# Patient Record
Sex: Male | Born: 1965 | Race: Black or African American | Hispanic: No | Marital: Married | Smoking: Never smoker
Health system: Southern US, Community
[De-identification: ages and names within clinical notes are randomized; demographics above are authoritative.]

## PROBLEM LIST (undated history)

## (undated) HISTORY — PX: KNEE SURGERY: SHX244

---

## 2000-10-03 ENCOUNTER — Encounter: Payer: Self-pay | Admitting: Orthopedic Surgery

## 2000-10-03 ENCOUNTER — Encounter: Admission: RE | Admit: 2000-10-03 | Discharge: 2000-10-03 | Payer: Self-pay | Admitting: Orthopedic Surgery

## 2006-08-12 ENCOUNTER — Ambulatory Visit: Payer: Self-pay | Admitting: Sports Medicine

## 2006-10-08 ENCOUNTER — Emergency Department (HOSPITAL_COMMUNITY): Admission: EM | Admit: 2006-10-08 | Discharge: 2006-10-08 | Payer: Self-pay | Admitting: Emergency Medicine

## 2007-01-16 ENCOUNTER — Encounter: Admission: RE | Admit: 2007-01-16 | Discharge: 2007-02-28 | Payer: Self-pay | Admitting: Occupational Medicine

## 2008-06-17 ENCOUNTER — Ambulatory Visit: Payer: Self-pay | Admitting: Internal Medicine

## 2008-06-24 LAB — CBC WITH DIFFERENTIAL/PLATELET
Eosinophils Absolute: 0.1 10*3/uL (ref 0.0–0.5)
LYMPH%: 44.2 % (ref 14.0–48.0)
MCHC: 33.3 g/dL (ref 32.0–35.9)
MCV: 83.9 fL (ref 81.6–98.0)
MONO%: 12 % (ref 0.0–13.0)
NEUT#: 1.3 10*3/uL — ABNORMAL LOW (ref 1.5–6.5)
NEUT%: 39.8 % — ABNORMAL LOW (ref 40.0–75.0)
Platelets: 177 10*3/uL (ref 145–400)
RBC: 4.99 10*6/uL (ref 4.20–5.71)

## 2008-06-24 LAB — COMPREHENSIVE METABOLIC PANEL
Alkaline Phosphatase: 76 U/L (ref 39–117)
BUN: 20 mg/dL (ref 6–23)
Creatinine, Ser: 1.27 mg/dL (ref 0.40–1.50)
Glucose, Bld: 72 mg/dL (ref 70–99)
Sodium: 137 mEq/L (ref 135–145)
Total Bilirubin: 0.4 mg/dL (ref 0.3–1.2)
Total Protein: 7.5 g/dL (ref 6.0–8.3)

## 2008-11-19 ENCOUNTER — Ambulatory Visit: Payer: Self-pay | Admitting: Internal Medicine

## 2008-11-21 LAB — COMPREHENSIVE METABOLIC PANEL
AST: 20 U/L (ref 0–37)
Albumin: 4.5 g/dL (ref 3.5–5.2)
Alkaline Phosphatase: 72 U/L (ref 39–117)
Potassium: 4.3 mEq/L (ref 3.5–5.3)
Sodium: 142 mEq/L (ref 135–145)
Total Protein: 7.3 g/dL (ref 6.0–8.3)

## 2008-11-21 LAB — CBC WITH DIFFERENTIAL/PLATELET
EOS%: 4.6 % (ref 0.0–7.0)
MCH: 27.9 pg — ABNORMAL LOW (ref 28.0–33.4)
MCV: 84.5 fL (ref 81.6–98.0)
MONO%: 12.1 % (ref 0.0–13.0)
RBC: 5.16 10*6/uL (ref 4.20–5.71)
RDW: 12.9 % (ref 11.2–14.6)

## 2009-02-24 ENCOUNTER — Ambulatory Visit: Payer: Self-pay | Admitting: Internal Medicine

## 2009-02-26 LAB — CBC WITH DIFFERENTIAL/PLATELET
Basophils Absolute: 0 10*3/uL (ref 0.0–0.1)
Eosinophils Absolute: 0.1 10*3/uL (ref 0.0–0.5)
HCT: 40.3 % (ref 38.4–49.9)
HGB: 13.4 g/dL (ref 13.0–17.1)
MCV: 84.2 fL (ref 79.3–98.0)
MONO%: 12 % (ref 0.0–14.0)
NEUT#: 1.5 10*3/uL (ref 1.5–6.5)
NEUT%: 47.5 % (ref 39.0–75.0)
RDW: 13 % (ref 11.0–14.6)
lymph#: 1.1 10*3/uL (ref 0.9–3.3)

## 2009-02-26 LAB — COMPREHENSIVE METABOLIC PANEL
Albumin: 4.3 g/dL (ref 3.5–5.2)
BUN: 21 mg/dL (ref 6–23)
Calcium: 9.8 mg/dL (ref 8.4–10.5)
Chloride: 104 mEq/L (ref 96–112)
Glucose, Bld: 61 mg/dL — ABNORMAL LOW (ref 70–99)
Potassium: 4 mEq/L (ref 3.5–5.3)

## 2009-02-28 ENCOUNTER — Encounter: Payer: Self-pay | Admitting: Internal Medicine

## 2009-02-28 ENCOUNTER — Other Ambulatory Visit: Admission: RE | Admit: 2009-02-28 | Discharge: 2009-02-28 | Payer: Self-pay | Admitting: Internal Medicine

## 2009-03-13 LAB — CBC WITH DIFFERENTIAL/PLATELET
Eosinophils Absolute: 0 10*3/uL (ref 0.0–0.5)
MONO#: 0.5 10*3/uL (ref 0.1–0.9)
MONO%: 8.2 % (ref 0.0–14.0)
NEUT#: 3.5 10*3/uL (ref 1.5–6.5)
RBC: 4.89 10*6/uL (ref 4.20–5.82)
RDW: 12.8 % (ref 11.0–14.6)
WBC: 5.7 10*3/uL (ref 4.0–10.3)
lymph#: 1.7 10*3/uL (ref 0.9–3.3)
nRBC: 0 % (ref 0–0)

## 2009-07-20 ENCOUNTER — Emergency Department (HOSPITAL_COMMUNITY): Admission: EM | Admit: 2009-07-20 | Discharge: 2009-07-20 | Payer: Self-pay | Admitting: Emergency Medicine

## 2011-03-03 LAB — BONE MARROW EXAM: Bone Marrow Exam: 123

## 2011-03-03 LAB — DIFFERENTIAL
Basophils Absolute: 0 10*3/uL (ref 0.0–0.1)
Basophils Relative: 2 % — ABNORMAL HIGH (ref 0–1)
Eosinophils Absolute: 0.1 10*3/uL (ref 0.0–0.7)
Eosinophils Relative: 3 % (ref 0–5)
Lymphocytes Relative: 44 % (ref 12–46)
Lymphs Abs: 1.3 10*3/uL (ref 0.7–4.0)
Monocytes Absolute: 0.3 10*3/uL (ref 0.1–1.0)
Monocytes Relative: 10 % (ref 3–12)
Neutro Abs: 1.2 10*3/uL — ABNORMAL LOW (ref 1.7–7.7)
Neutrophils Relative %: 42 % — ABNORMAL LOW (ref 43–77)

## 2011-03-03 LAB — CBC
HCT: 41.2 % (ref 39.0–52.0)
Hemoglobin: 13.9 g/dL (ref 13.0–17.0)
MCHC: 33.6 g/dL (ref 30.0–36.0)
MCV: 85.4 fL (ref 78.0–100.0)
Platelets: 159 10*3/uL (ref 150–400)
RBC: 4.82 MIL/uL (ref 4.22–5.81)
RDW: 12.1 % (ref 11.5–15.5)
WBC: 2.9 10*3/uL — ABNORMAL LOW (ref 4.0–10.5)

## 2011-03-03 LAB — CHROMOSOME ANALYSIS, BONE MARROW

## 2011-07-21 ENCOUNTER — Other Ambulatory Visit: Payer: Self-pay | Admitting: Orthopedic Surgery

## 2011-07-21 ENCOUNTER — Ambulatory Visit (HOSPITAL_BASED_OUTPATIENT_CLINIC_OR_DEPARTMENT_OTHER)
Admission: RE | Admit: 2011-07-21 | Discharge: 2011-07-21 | Disposition: A | Discharge status: Home / Self Care | Payer: BC Managed Care – PPO | Source: Ambulatory Visit | Attending: Orthopedic Surgery | Admitting: Orthopedic Surgery

## 2011-07-21 DIAGNOSIS — L851 Acquired keratosis [keratoderma] palmaris et plantaris: Secondary | ICD-10-CM | POA: Insufficient documentation

## 2011-07-21 DIAGNOSIS — Z01812 Encounter for preprocedural laboratory examination: Secondary | ICD-10-CM | POA: Insufficient documentation

## 2011-07-21 DIAGNOSIS — L608 Other nail disorders: Secondary | ICD-10-CM | POA: Insufficient documentation

## 2011-07-22 LAB — KOH PREP: KOH Prep: NONE SEEN

## 2011-08-17 LAB — CULTURE, FUNGUS WITHOUT SMEAR

## 2011-08-24 NOTE — Op Note (Signed)
  NAMEGIRARD, KOONTZ            ACCOUNT NO.:  [id number]  MEDICAL RECORD NO.:  [id number]  LOCATION:                                 FACILITY:  PHYSICIAN:  Cindee Salt, M.D.       DATE OF BIRTH:  28-Jul-1966  DATE OF PROCEDURE:  07/21/2011 DATE OF DISCHARGE:                              OPERATIVE REPORT   PREOPERATIVE DIAGNOSIS:  Pigmented lesion, right ring finger nail bed.  POSTOPERATIVE DIAGNOSIS:  Pigmented lesion, right ring finger nail bed.  OPERATION:  Biopsy of pigmented lesion, right ring finger.  SURGEON:  Cindee Salt, MD  ANESTHESIA:  General with local infiltration, metacarpal block.  ANESTHESIOLOGIST:  Bedelia Person, MD  HISTORY:  The patient is a 45 year old male with a history of a pigmented lesion in his nail which has not disappeared.  He is admitted for biopsy.  Pre, peri, and postoperative course have been discussed along with risks and complications.  He is aware there is no guarantee with surgery, possibility of infection, recurrence, injury to arteries, nerves, tendons, incomplete relief of symptoms, dystrophy, possibility of this being a lesion other than infection.  He has elected to proceed to have this biopsied.  He is well aware of the potential for deformity to the nail on regrowth.  In the preoperative area, the patient is seen. The extremity marked by both the patient and surgeon.  Antibiotic given.  PROCEDURE IN DETAIL:  The patient was brought to the operating room where a general anesthetic was carried out without difficulty.  He was prepped using ChloraPrep, supine position with the right arm free.  A 3- minute dry time was allowed.  Time-out taken confirming the patient and procedure.  The nail plate was removed.  This was sent to pathology. The margin was then biopsied longitudinally.  This was sent to pathology for microscopic inspection.  The nail plate was cut.  Portions were sent for both culture and examination for KOH stain.  The wound  was irrigated.  The nail matrix was then repaired with 6-0 chromic sutures in a horizontal mattress manner.  A nonadherent gauze was placed in the nail fold.  A sterile compressive dressing and splint applied.  A metacarpal block was given with 0.25% Marcaine without epinephrine, approximately 7 mL was used.  The patient tolerated the procedure well and was taken to the recovery room for observation in satisfactory condition.  A Penrose drain was used for tourniquet control at the base of the finger.  This was removed prior to discharge from the operating room.  DISCHARGE SUMMARY:  He will be discharged home to return to the [facility] in 1 week on [place] [facility].          ______________________________ Cindee Salt, M.D.     GK/MEDQ  D:  07/21/2011  T:  07/21/2011  Job:  [id number]  Electronically Signed by Cindee Salt M.D. on 08/24/2011 04:39:19 PM

## 2013-08-09 ENCOUNTER — Other Ambulatory Visit: Payer: Self-pay | Admitting: Family Medicine

## 2013-08-09 ENCOUNTER — Ambulatory Visit
Admission: RE | Admit: 2013-08-09 | Discharge: 2013-08-09 | Disposition: A | Discharge status: Home / Self Care | Payer: BC Managed Care – PPO | Source: Ambulatory Visit | Attending: Family Medicine | Admitting: Family Medicine

## 2013-08-09 DIAGNOSIS — M129 Arthropathy, unspecified: Secondary | ICD-10-CM

## 2013-08-09 IMAGING — CR DG KNEE 1-2V*R*
2 series · 2 of 2 positions shown · non-contrast
Comparison: None.

CLINICAL DATA: Two months of pain

EXAM:
RIGHT KNEE - 1-2 VIEW

[t knee ap right]
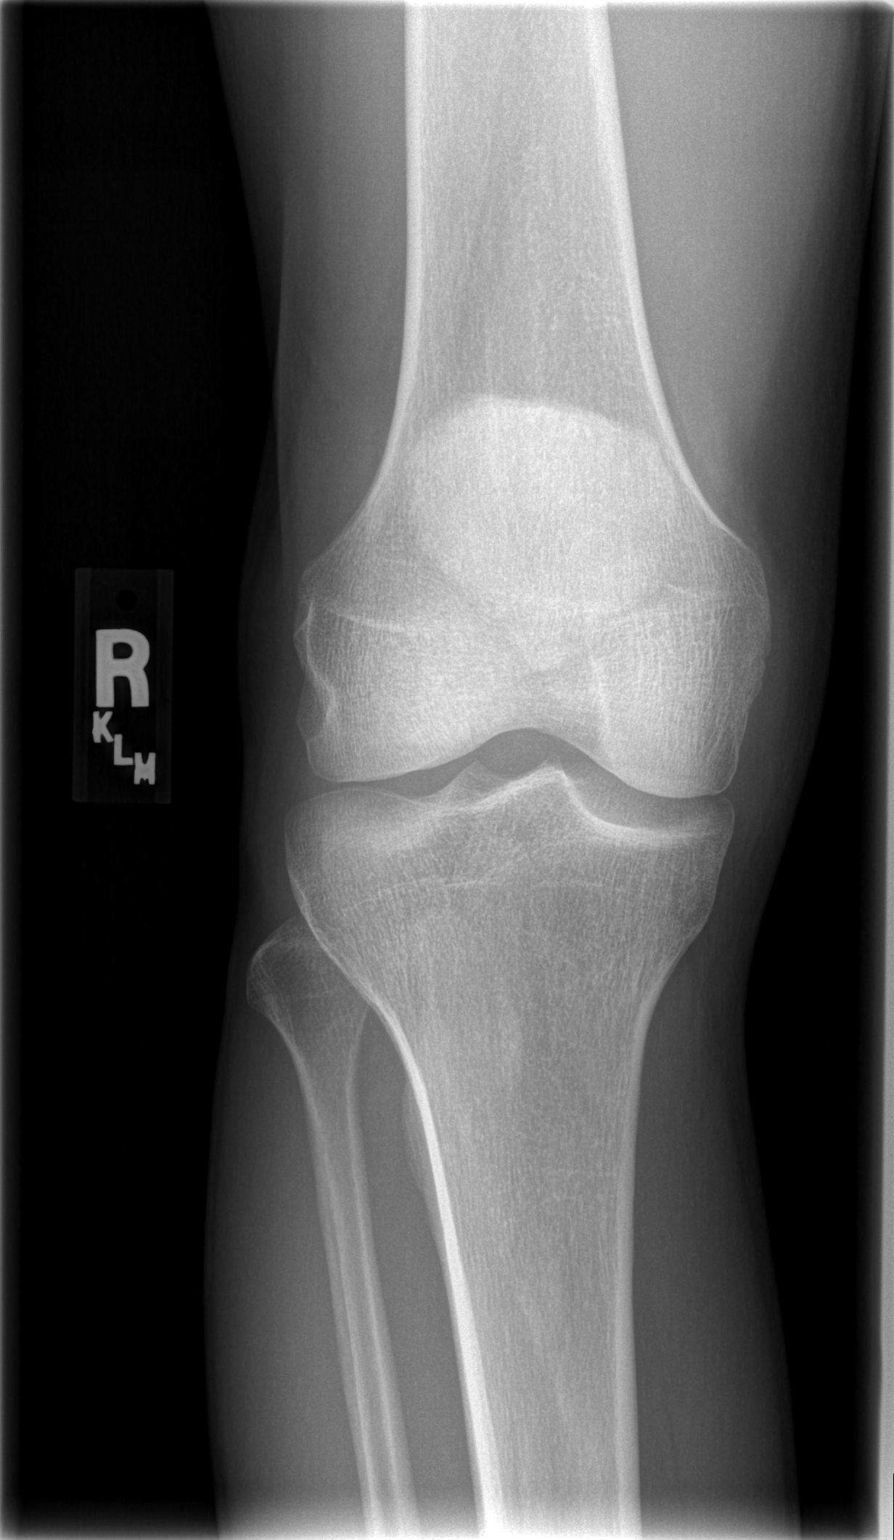

[t knee lat right]
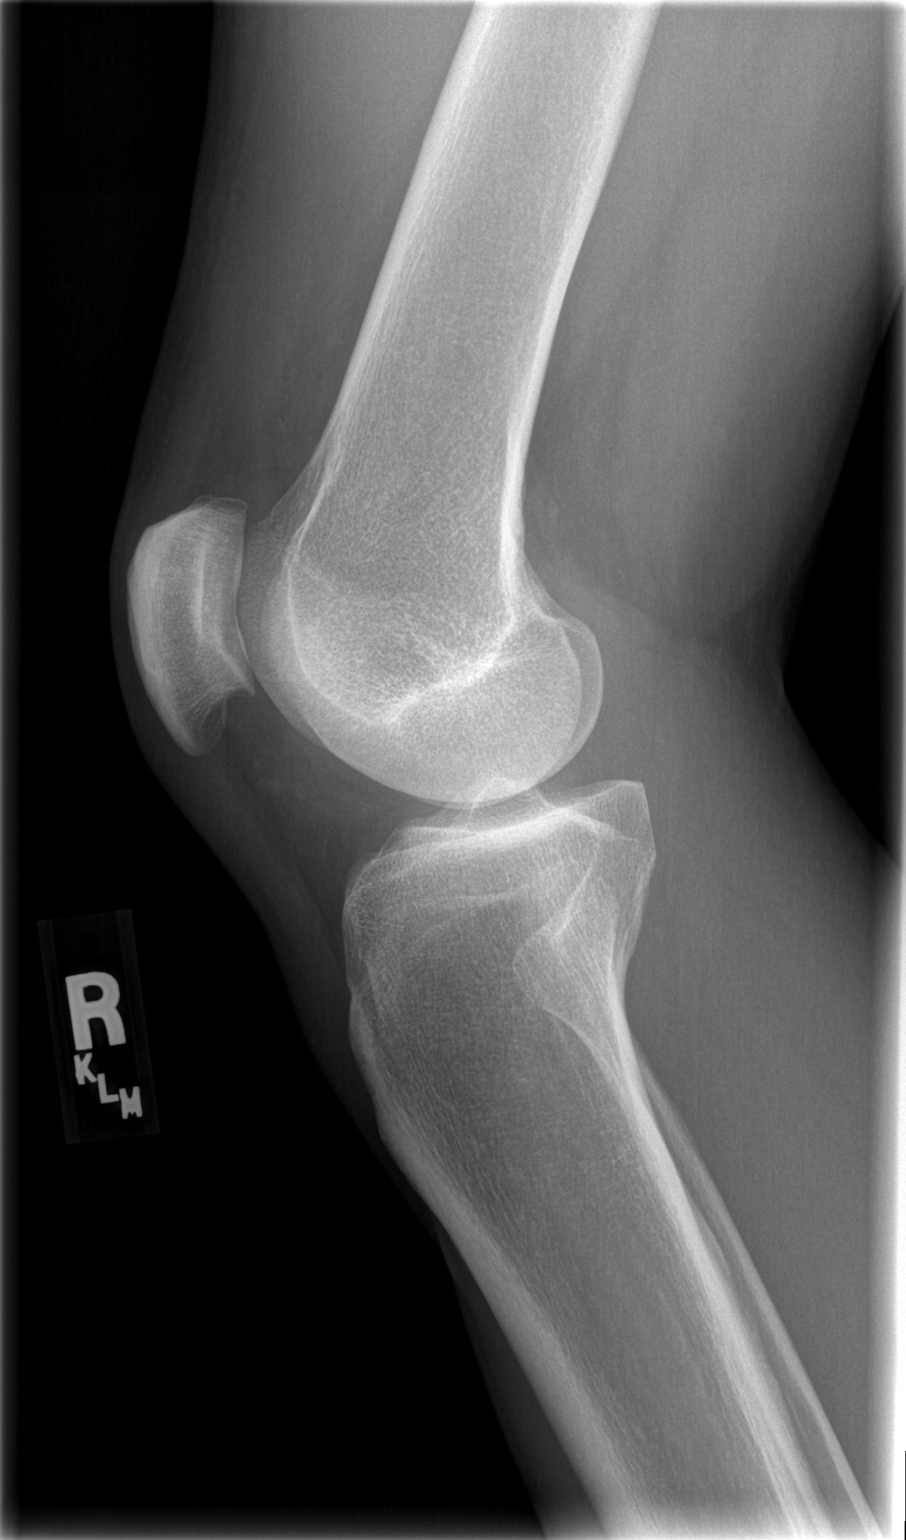

[2 of 2 positions shown; findings below may reference images not displayed]

FINDINGS: The right knee joint spaces appear normal. No fracture is seen. No
joint effusion is noted.
IMPRESSION: Negative.

## 2015-10-13 ENCOUNTER — Ambulatory Visit (INDEPENDENT_AMBULATORY_CARE_PROVIDER_SITE_OTHER): Payer: BLUE CROSS/BLUE SHIELD

## 2015-10-13 ENCOUNTER — Encounter: Payer: Self-pay | Admitting: Podiatry

## 2015-10-13 ENCOUNTER — Ambulatory Visit: Payer: BLUE CROSS/BLUE SHIELD

## 2015-10-13 ENCOUNTER — Ambulatory Visit (INDEPENDENT_AMBULATORY_CARE_PROVIDER_SITE_OTHER): Payer: BLUE CROSS/BLUE SHIELD | Admitting: Podiatry

## 2015-10-13 VITALS — BP 136/86 | HR 59 | Resp 16

## 2015-10-13 DIAGNOSIS — M779 Enthesopathy, unspecified: Secondary | ICD-10-CM

## 2015-10-13 DIAGNOSIS — M79672 Pain in left foot: Secondary | ICD-10-CM | POA: Diagnosis not present

## 2015-10-13 DIAGNOSIS — M2011 Hallux valgus (acquired), right foot: Secondary | ICD-10-CM | POA: Diagnosis not present

## 2015-10-13 NOTE — Patient Instructions (Signed)
Pre-Operative Instructions  Congratulations, you have decided to take an important step to improving your quality of life.  You can be assured that the doctors of [facility] will be with you every step of the way.  1. Plan to be at the surgery center/hospital at least 1 (one) hour prior to your scheduled time unless otherwise directed by the surgical center/hospital staff.  You must have a responsible adult accompany you, remain during the surgery and drive you home.  Make sure you have directions to the surgical center/hospital and know how to get there on time. 2. For hospital based surgery you will need to obtain a history and physical form from your family physician within 1 month prior to the date of surgery- we will give you a form for you primary physician.  3. We make every effort to accommodate the date you request for surgery.  There are however, times where surgery dates or times have to be moved.  We will contact you as soon as possible if a change in schedule is required.   4. No Aspirin/Ibuprofen for one week before surgery.  If you are on aspirin, any non-steroidal anti-inflammatory medications (Mobic, Aleve, Ibuprofen) you should stop taking it 7 days prior to your surgery.  You make take Tylenol  For pain prior to surgery.  5. Medications- If you are taking daily heart and blood pressure medications, seizure, reflux, allergy, asthma, anxiety, pain or diabetes medications, make sure the surgery center/hospital is aware before the day of surgery so they may notify you which medications to take or avoid the day of surgery. 6. No food or drink after midnight the night before surgery unless directed otherwise by surgical center/hospital staff. 7. No alcoholic beverages 24 hours prior to surgery.  No smoking 24 hours prior to or 24 hours after surgery. 8. Wear loose pants or shorts- loose enough to fit over bandages, boots, and casts. 9. No slip on shoes, sneakers are best. 10. Bring  your boot with you to the surgery center/hospital.  Also bring crutches or a walker if your physician has prescribed it for you.  If you do not have this equipment, it will be provided for you after surgery. 11. If you have not been contracted by the surgery center/hospital by the day before your surgery, call to confirm the date and time of your surgery. 12. Leave-time from work may vary depending on the type of surgery you have.  Appropriate arrangements should be made prior to surgery with your employer. 13. Prescriptions will be provided immediately following surgery by your doctor.  Have these filled as soon as possible after surgery and take the medication as directed. 14. Remove nail polish on the operative foot. 15. Wash the night before surgery.  The night before surgery wash the foot and leg well with the antibacterial soap provided and water paying special attention to beneath the toenails and in between the toes.  Rinse thoroughly with water and dry well with a towel.  Perform this wash unless told not to do so by your physician.  Enclosed: 1 Ice pack (please put in freezer the night before surgery)   1 Hibiclens skin cleaner   Pre-op Instructions  If you have any questions regarding the instructions, do not hesitate to call our office.  [place]: [address] [place], [place] [address] [phone]  [place]: [address] [place], [place] [address] [phone]  [place]: [address]  [place],  [address] [phone]  Dr. Richard   Tuchman DPM, Dr. Norman Regal DPM Dr. Richard Sikora DPM, Dr. M. Todd Hyatt DPM, Dr. Kathryn Egerton DPM 

## 2015-10-13 NOTE — Progress Notes (Signed)
   Subjective:    Patient ID: Timothy Vincent, male    DOB: [birth year], 49 y.o.   MRN: [id number]  HPI Comments: "I have a bunion"  Patient presents with: Foot Pain: 1st MPJ right - aching for several months, active runner, but has slowed down due to foot and knee pain, some redness and swelling, no treatment.       Review of Systems  Musculoskeletal: Positive for arthralgias.       Finger joint   All other systems reviewed and are negative.      Objective:   Physical Exam        Assessment & Plan:

## 2015-10-14 NOTE — Progress Notes (Signed)
Subjective:     Patient ID: Timothy Vincent, male   DOB: 13-Aug-1966, 49 y.o.   MRN: [id number]  HPI patient states the bunion around my big toe joint has started to bother me a lot more the last year and especially last few months and that I tried wider shoes to accommodate and I've tried reduced activity without success. I had my left one fixed in number of years ago and I do have flatfeet   Review of Systems  All other systems reviewed and are negative.      Objective:   Physical Exam  Constitutional: He is oriented to person, place, and time.  Cardiovascular: Intact distal pulses.   Musculoskeletal: Normal range of motion.  Neurological: He is oriented to person, place, and time.  Skin: Skin is warm.  Nursing note and vitals reviewed.  neurovascular status intact muscle strength adequate range of motion within normal limits with hyperostosis around the first metatarsal head right with redness noted and deviation of the hallux against the second toe. It is painful when palpated and patient's also noted to have depression of the arch bilateral with mild hypertrophy of the navicular bilateral. 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 Patient wants surgery and [facility] and today I did allow him to read a consent form reviewing alternative treatments complications associated with this procedure and after extensive review the patient signed. I did dispense air fracture walker with instructions on usage and also scanned for custom orthotics for the postoperative period and encouraged him to call  with any questions he might have prior to procedure

## 2015-11-05 DIAGNOSIS — M2011 Hallux valgus (acquired), right foot: Secondary | ICD-10-CM | POA: Diagnosis not present

## 2015-11-13 ENCOUNTER — Ambulatory Visit (INDEPENDENT_AMBULATORY_CARE_PROVIDER_SITE_OTHER): Payer: BLUE CROSS/BLUE SHIELD | Admitting: Podiatry

## 2015-11-13 ENCOUNTER — Ambulatory Visit (INDEPENDENT_AMBULATORY_CARE_PROVIDER_SITE_OTHER): Payer: BLUE CROSS/BLUE SHIELD

## 2015-11-13 VITALS — Temp 98.1°F

## 2015-11-13 DIAGNOSIS — M2011 Hallux valgus (acquired), right foot: Secondary | ICD-10-CM

## 2015-11-13 DIAGNOSIS — Z09 Encounter for follow-up examination after completed treatment for conditions other than malignant neoplasm: Secondary | ICD-10-CM

## 2015-11-17 NOTE — Progress Notes (Signed)
He presents today for his first postop visit date [facility]. He states he seems to be doing pretty well.  Objective: Vital signs are stable he is alert and oriented 3. Pulses are palpable. Dry sterile dressing intact was removed demonstrates minimal edema no erythema saline as drainage or odor. Good range of motion of the toe. Radiographs confirmed good alignment of the toe with internal fixation.  Assessment: Well-healing surgical foot right.  Plan: Redressed today dry sterile compressive dressing continue use of his Cam Walker keep the foot dry and clean will follow-up with primary surgeon in 1 week.

## 2015-11-19 ENCOUNTER — Telehealth: Payer: Self-pay | Admitting: *Deleted

## 2015-11-19 NOTE — Telephone Encounter (Signed)
Pt asked when can get foot shower wet.  I spoke with Tomasa HoseJ. Quintana, RN, she states Dr. Charlsie Merlesegal allows pt to get surgical foot shower wet after 1st post-op and if the incision site is closed, then must cover with gauze and ace after each shower.  Pt states his is still covered by a dressing from his 1st POV. I told him to remove the dressing and if the surgical site was closed without drainage to get foot shower wet, pat dry and cover with clean gauze and ace wrap after every shower.  Pt states understanding.

## 2015-11-20 ENCOUNTER — Encounter: Payer: Self-pay | Admitting: Podiatry

## 2015-11-20 NOTE — Progress Notes (Signed)
DOS 11-05-15  [place] bunionectomy right   Rx'd Percocet 10/325 #35

## 2015-11-26 ENCOUNTER — Ambulatory Visit (INDEPENDENT_AMBULATORY_CARE_PROVIDER_SITE_OTHER): Payer: BLUE CROSS/BLUE SHIELD

## 2015-11-26 ENCOUNTER — Ambulatory Visit (INDEPENDENT_AMBULATORY_CARE_PROVIDER_SITE_OTHER): Payer: BLUE CROSS/BLUE SHIELD | Admitting: Podiatry

## 2015-11-26 DIAGNOSIS — Z9889 Other specified postprocedural states: Secondary | ICD-10-CM

## 2015-11-26 DIAGNOSIS — M2011 Hallux valgus (acquired), right foot: Secondary | ICD-10-CM

## 2015-11-27 ENCOUNTER — Other Ambulatory Visit: Payer: BLUE CROSS/BLUE SHIELD

## 2015-12-01 NOTE — Progress Notes (Signed)
Subjective:     Patient ID: Timothy Vincent, male   DOB: 1966/05/22, 50 y.o.   MRN: [id number]  HPI patient presents stating I'm doing well with my right foot but still having some discomfort if him on it all day   Review of Systems     Objective:   Physical Exam  neurovascular status intact good structural alignment with wound edges that are well coapted hallux in rectus position and good range of motion    Assessment:      doing well post osteotomy right    Plan:      reviewed x-ray allow patient to gradually return soft shoe gear and activity and reappoint to recheck in 4 weeks

## 2015-12-24 ENCOUNTER — Ambulatory Visit: Payer: Self-pay

## 2015-12-24 ENCOUNTER — Ambulatory Visit (INDEPENDENT_AMBULATORY_CARE_PROVIDER_SITE_OTHER): Payer: BLUE CROSS/BLUE SHIELD | Admitting: Podiatry

## 2015-12-24 DIAGNOSIS — M2011 Hallux valgus (acquired), right foot: Secondary | ICD-10-CM

## 2015-12-24 DIAGNOSIS — Z09 Encounter for follow-up examination after completed treatment for conditions other than malignant neoplasm: Secondary | ICD-10-CM

## 2015-12-24 DIAGNOSIS — Z9889 Other specified postprocedural states: Secondary | ICD-10-CM

## 2015-12-24 NOTE — Progress Notes (Signed)
Subjective:     Patient ID: Timothy Vincent, male   DOB: Aug 21, 1966, 50 y.o.   MRN: [id number]  HPI patient states I'm doing well with mild discomfort if I been on it too long but overall doing well   Review of Systems     Objective:   Physical Exam  neurovascular status intact negative Homans sign noted with excellent range of motion first MPJ with mild forefoot edema right with no specific areas of pain    Assessment:      doing well post [place] osteotomy right with mild forefoot pain consistent with healing    Plan:      reviewed x-rays and advised on continued elevation at times and gradual increase in activity levels over the next 6-8 weeks with patient to read a turn as needed

## 2015-12-25 ENCOUNTER — Other Ambulatory Visit: Payer: BLUE CROSS/BLUE SHIELD

## 2016-09-16 ENCOUNTER — Ambulatory Visit (INDEPENDENT_AMBULATORY_CARE_PROVIDER_SITE_OTHER): Payer: BLUE CROSS/BLUE SHIELD | Admitting: Podiatry

## 2016-09-16 ENCOUNTER — Encounter: Payer: Self-pay | Admitting: Podiatry

## 2016-09-16 ENCOUNTER — Ambulatory Visit (INDEPENDENT_AMBULATORY_CARE_PROVIDER_SITE_OTHER): Payer: BLUE CROSS/BLUE SHIELD

## 2016-09-16 VITALS — BP 109/67 | HR 57 | Resp 16

## 2016-09-16 DIAGNOSIS — M779 Enthesopathy, unspecified: Secondary | ICD-10-CM | POA: Diagnosis not present

## 2016-09-16 DIAGNOSIS — M2011 Hallux valgus (acquired), right foot: Secondary | ICD-10-CM

## 2016-09-16 DIAGNOSIS — Z472 Encounter for removal of internal fixation device: Secondary | ICD-10-CM | POA: Diagnosis not present

## 2016-09-16 DIAGNOSIS — M79671 Pain in right foot: Secondary | ICD-10-CM

## 2016-09-16 NOTE — Patient Instructions (Signed)

## 2016-09-26 NOTE — Progress Notes (Signed)
Subjective:     Patient ID: Timothy Vincent, male   DOB: Apr 25, 1966, 50 y.o.   MRN: [id number]  HPI patient states she is still having some pain on top of his right foot and at times it can be quite bothersome. Certain shoes seem to bother it more and he's not sure as to why it seems to do this   Review of Systems     Objective:   Physical Exam Neurovascular status is intact negative Homan sign was noted and well-healed surgical site is presented right first metatarsal with excellent range of motion with no crepitus within the joint and possibility for mild prominence of the first MPJ right and also mild inflammation of the first MPJ with no loss of motion or crepitus    Assessment:     Possibility that there could be low grade irritation from the screw or allergy reaction versus some kind of unknown inflammatory process    Plan:     H&P condition reviewed and I discussed this with patient. I do think that removal of screw at this point may be of benefit even though there is no guarantee this will solve his problem along with a newer type orthotic to try to reduce the pressure against the first MPJ. I would also consider injection of the joint at the time of screw removal and at this time I allowed patient to read consent form reviewing correction of deformity and the fact that absolutely there is no long-term guarantees this will solve the problem. Patient wants surgery understanding the risk associated with procedure and signs consent form and we scanned for custom orthotic devices at this time for controlling motion of the foot due to the tenderness condition and significant flatfoot deformity. Scheduled for procedure and encouraged to call with questions prior

## 2016-09-28 ENCOUNTER — Encounter: Payer: Self-pay | Admitting: Podiatry

## 2016-09-28 DIAGNOSIS — Z4889 Encounter for other specified surgical aftercare: Secondary | ICD-10-CM | POA: Diagnosis not present

## 2016-09-28 DIAGNOSIS — Z472 Encounter for removal of internal fixation device: Secondary | ICD-10-CM | POA: Diagnosis not present

## 2016-09-28 DIAGNOSIS — Z01818 Encounter for other preprocedural examination: Secondary | ICD-10-CM | POA: Diagnosis not present

## 2016-09-29 ENCOUNTER — Telehealth: Payer: Self-pay | Admitting: *Deleted

## 2016-09-29 NOTE — Telephone Encounter (Signed)
Pt asked how long he had to wear the surgical shoe, and how long not to get the area wet. Informed pt he needed to remain in the surgical shoe and keep the dressing clean and dry until he was seen for the 1st POV. Pt states understanding.

## 2016-10-08 ENCOUNTER — Ambulatory Visit (INDEPENDENT_AMBULATORY_CARE_PROVIDER_SITE_OTHER): Payer: BLUE CROSS/BLUE SHIELD | Admitting: Podiatry

## 2016-10-08 ENCOUNTER — Ambulatory Visit (INDEPENDENT_AMBULATORY_CARE_PROVIDER_SITE_OTHER): Payer: BLUE CROSS/BLUE SHIELD

## 2016-10-08 ENCOUNTER — Encounter: Payer: Self-pay | Admitting: Podiatry

## 2016-10-08 VITALS — BP 123/68 | HR 62 | Resp 16

## 2016-10-08 DIAGNOSIS — Z472 Encounter for removal of internal fixation device: Secondary | ICD-10-CM

## 2016-10-08 DIAGNOSIS — M779 Enthesopathy, unspecified: Secondary | ICD-10-CM

## 2016-10-08 DIAGNOSIS — M2011 Hallux valgus (acquired), right foot: Secondary | ICD-10-CM

## 2016-10-08 DIAGNOSIS — L600 Ingrowing nail: Secondary | ICD-10-CM

## 2016-10-08 DIAGNOSIS — M79671 Pain in right foot: Secondary | ICD-10-CM

## 2016-10-08 NOTE — Progress Notes (Signed)
Subjective:     Patient ID: Timothy Vincent, male   DOB: 31-Jul-1966, 50 y.o.   MRN: [id number]  HPI patient presents stating that he is doing well with his right foot with minimal discomfort and wound edges well coapted and had also is here to pickup orthotics   Review of Systems     Objective:   Physical Exam Neurovascular status intact with well-healed surgical site dorsal right with wound edges well coapted stitches in place and no swelling    Assessment:     Doing well post pin removal right    Plan:     Stitches removed with wound edges coapted well applied sterile dressing and instructed on gradual return to activity and did dispensed orthotics with instructions on usage today

## 2016-10-18 ENCOUNTER — Ambulatory Visit (INDEPENDENT_AMBULATORY_CARE_PROVIDER_SITE_OTHER): Payer: BLUE CROSS/BLUE SHIELD

## 2016-10-18 ENCOUNTER — Ambulatory Visit (INDEPENDENT_AMBULATORY_CARE_PROVIDER_SITE_OTHER): Payer: BLUE CROSS/BLUE SHIELD | Admitting: Podiatry

## 2016-10-18 ENCOUNTER — Encounter: Payer: Self-pay | Admitting: Podiatry

## 2016-10-18 VITALS — BP 122/78 | HR 64 | Resp 16

## 2016-10-18 DIAGNOSIS — L02619 Cutaneous abscess of unspecified foot: Secondary | ICD-10-CM

## 2016-10-18 DIAGNOSIS — Z9889 Other specified postprocedural states: Secondary | ICD-10-CM

## 2016-10-18 DIAGNOSIS — M79671 Pain in right foot: Secondary | ICD-10-CM

## 2016-10-18 DIAGNOSIS — L03119 Cellulitis of unspecified part of limb: Secondary | ICD-10-CM

## 2016-10-18 NOTE — Progress Notes (Signed)
Subjective:     Patient ID: Timothy Vincent, male   DOB: 1966-08-10, 50 y.o.   MRN: [id number]  HPI patient presents stating he's had a very slight opening of his incision site on his right first metatarsal and he wanted to get it looked at. He's noted a small amount of serous drainage but no redness no proximal edema erythema or drainage   Review of Systems     Objective:   Physical Exam Neurovascular status is intact with approximate 4 mm length opening of the digit incision site on the first metatarsal with minimal gapping noted and crusted tissue formation. I did not note any drainage there is no proximal edema or erythema noted within this area    Assessment:     Very small dehiscence of the right incision site metatarsal with no indications currently of infection    Plan:     Educated patient on condition and applied Steri-Strips with instructions on usage along with topical antibiotic. Gave strict instructions of any redness should occur drainage or does not heal the we may need to place on antibiotic or other treatment but at this point it should heal uneventfully

## 2016-10-29 ENCOUNTER — Encounter: Payer: Self-pay | Admitting: Podiatry

## 2016-11-08 NOTE — Progress Notes (Signed)
DOS 11.07.2017 Removal Screw from Right Foot

## 2017-02-08 DIAGNOSIS — R42 Dizziness and giddiness: Secondary | ICD-10-CM | POA: Diagnosis not present

## 2017-02-10 DIAGNOSIS — B029 Zoster without complications: Secondary | ICD-10-CM | POA: Diagnosis not present

## 2017-02-10 DIAGNOSIS — K146 Glossodynia: Secondary | ICD-10-CM | POA: Diagnosis not present

## 2017-02-13 DIAGNOSIS — B029 Zoster without complications: Secondary | ICD-10-CM | POA: Diagnosis not present

## 2017-02-13 DIAGNOSIS — R110 Nausea: Secondary | ICD-10-CM | POA: Diagnosis not present

## 2017-02-16 DIAGNOSIS — B0089 Other herpesviral infection: Secondary | ICD-10-CM | POA: Diagnosis not present

## 2017-02-25 DIAGNOSIS — G510 Bell's palsy: Secondary | ICD-10-CM | POA: Diagnosis not present

## 2017-03-03 ENCOUNTER — Encounter (HOSPITAL_COMMUNITY): Payer: Self-pay | Admitting: *Deleted

## 2017-03-03 ENCOUNTER — Emergency Department (HOSPITAL_COMMUNITY)
Admission: EM | Admit: 2017-03-03 | Discharge: 2017-03-03 | Disposition: A | Discharge status: Home / Self Care | Payer: BLUE CROSS/BLUE SHIELD | Attending: Emergency Medicine | Admitting: Emergency Medicine

## 2017-03-03 DIAGNOSIS — H81391 Other peripheral vertigo, right ear: Secondary | ICD-10-CM | POA: Insufficient documentation

## 2017-03-03 DIAGNOSIS — R42 Dizziness and giddiness: Secondary | ICD-10-CM | POA: Diagnosis not present

## 2017-03-03 LAB — CBC
HCT: 50 % (ref 39.0–52.0)
Hemoglobin: 16.4 g/dL (ref 13.0–17.0)
MCH: 27.8 pg (ref 26.0–34.0)
MCHC: 32.8 g/dL (ref 30.0–36.0)
MCV: 84.9 fL (ref 78.0–100.0)
PLATELETS: 209 10*3/uL (ref 150–400)
RBC: 5.89 MIL/uL — ABNORMAL HIGH (ref 4.22–5.81)
RDW: 13 % (ref 11.5–15.5)
WBC: 3.2 10*3/uL — AB (ref 4.0–10.5)

## 2017-03-03 LAB — URINALYSIS, ROUTINE W REFLEX MICROSCOPIC
BILIRUBIN URINE: NEGATIVE
Glucose, UA: NEGATIVE mg/dL
Hgb urine dipstick: NEGATIVE
KETONES UR: 20 mg/dL — AB
LEUKOCYTES UA: NEGATIVE
NITRITE: NEGATIVE
PROTEIN: NEGATIVE mg/dL
Specific Gravity, Urine: 1.027 (ref 1.005–1.030)
pH: 5 (ref 5.0–8.0)

## 2017-03-03 LAB — BASIC METABOLIC PANEL
Anion gap: 10 (ref 5–15)
BUN: 18 mg/dL (ref 6–20)
CALCIUM: 10.4 mg/dL — AB (ref 8.9–10.3)
CO2: 27 mmol/L (ref 22–32)
CREATININE: 1.22 mg/dL (ref 0.61–1.24)
Chloride: 102 mmol/L (ref 101–111)
GFR calc Af Amer: >60 mL/min (ref >60)
GLUCOSE: 96 mg/dL (ref 65–99)
Potassium: 4.6 mmol/L (ref 3.5–5.1)
SODIUM: 139 mmol/L (ref 135–145)

## 2017-03-03 LAB — CBG MONITORING, ED: GLUCOSE-CAPILLARY: 92 mg/dL (ref 65–99)

## 2017-03-03 MED ORDER — ONDANSETRON 4 MG PO TBDP
4.0000 mg | ORAL_TABLET | Freq: Once | ORAL | Status: AC
  Administered 2017-03-03: 4 mg via ORAL
  Filled 2017-03-03: qty 1

## 2017-03-03 MED ORDER — ONDANSETRON 4 MG PO TBDP: 4.0000 mg | ORAL_TABLET | Freq: Three times a day (TID) | ORAL | 0 refills | Status: AC | PRN

## 2017-03-03 MED ORDER — LORAZEPAM 1 MG PO TABS
1.0000 mg | ORAL_TABLET | Freq: Once | ORAL | Status: AC
  Administered 2017-03-03: 1 mg via ORAL
  Filled 2017-03-03: qty 1

## 2017-03-03 MED ORDER — MECLIZINE HCL 25 MG PO TABS: 25.0000 mg | ORAL_TABLET | Freq: Three times a day (TID) | ORAL | 0 refills | Status: AC

## 2017-03-03 NOTE — Discharge Instructions (Addendum)
Please read and follow all provided instructions.  Your diagnoses today include:  1. Peripheral vertigo involving right ear     Tests performed today include: Vital signs. See below for your results today.   Medications prescribed:  Take as prescribed   Home care instructions:  Follow any educational materials contained in this packet.  Follow-up instructions: Please follow-up with your primary care provider for further evaluation of symptoms and treatment   Return instructions:  Please return to the Emergency Department if you do not get better, if you get worse, or new symptoms OR  - Fever (temperature greater than 101.57F)  - Bleeding that does not stop with holding pressure to the area    -Severe pain (please note that you may be more sore the day after your accident)  - Chest Pain  - Difficulty breathing  - Severe nausea or vomiting  - Inability to tolerate food and liquids  - Passing out  - Skin becoming red around your wounds  - Change in mental status (confusion or lethargy)  - New numbness or weakness    Please return if you have any other emergent concerns.  Additional Information:  Your vital signs today were: BP 115/68 (BP Location: Right Arm)    Pulse 98    Temp 97.8 F (36.6 C) (Oral)    Resp 18    SpO2 100%  If your blood pressure (BP) was elevated above 135/85 this visit, please have this repeated by your doctor within one month. ---------------

## 2017-03-03 NOTE — ED Provider Notes (Signed)
[facility]-EMERGENCY DEPT Provider Note   CSN: [id number] Arrival date & time: 03/03/17  1144  History   Chief Complaint Chief Complaint  Patient presents with  . Dizziness  . Weakness    HPI Timothy Vincent is a 51 y.o. male.  HPI  51 y.o. male presents to the Emergency Department today from PCP due to lightheadedness and dizziness x 3 weeks. Saw PCP at that time and Dx shingles due to erythema around ear with lesions suspected. Area resolved with valtrex and short prednisone burst. Pt return 2 weeks ago and found to have Bells Palsy on right side. Started on high dose steroids, which allowed it to resolve. Pt states that last Sunday he was on his steroid dosing regiment when he became nauseated and lightheaded. One Episode of emesis. Noted dizziness and continues and weakness. N/V resolved since then, but continues sensation of room spinning. No headache. No fevers. No CP/SOB/ABD pain. No numbness/tingling. No other symptoms noted.   History reviewed. No pertinent past medical history.  There are no active problems to display for this patient.   Past Surgical History:  Procedure Laterality Date  . KNEE SURGERY       Home Medications    Prior to Admission medications   Not on File    Family History No family history on file.  Social History Social History  Substance Use Topics  . Smoking status: Never Smoker  . Smokeless tobacco: Never Used  . Alcohol use 0.0 oz/week     Allergies   Codeine and Prednisone   Review of Systems Review of Systems ROS reviewed and all are negative for acute change except as noted in the HPI.  Physical Exam Updated Vital Signs BP 115/68 (BP Location: Right Arm)   Pulse 98   Temp 97.8 F (36.6 C) (Oral)   Resp 18   SpO2 100%   Physical Exam  Constitutional: He is oriented to person, place, and time. Vital signs are normal. He appears well-developed and well-nourished.  HENT:  Head: Normocephalic and atraumatic.  Right Ear:  Hearing normal.  Left Ear: Hearing normal.  Eyes: Conjunctivae and EOM are normal. Pupils are equal, round, and reactive to light.  Neck: Normal range of motion. Neck supple.  Cardiovascular: Normal rate, regular rhythm, normal heart sounds and intact distal pulses.   Pulmonary/Chest: Effort normal and breath sounds normal.  Abdominal: Soft. There is no tenderness.  Musculoskeletal: Normal range of motion.  Neurological: He is alert and oriented to person, place, and time. He has normal strength. No cranial nerve deficit or sensory deficit.  Cranial Nerves:  II: Pupils equal, round, reactive to light III,IV, VI: ptosis not present, extra-ocular motions intact bilaterally  V,VII: smile symmetric, facial light touch sensation equal VIII: hearing grossly normal bilaterally  IX,X: midline uvula rise  XI: bilateral shoulder shrug equal and strong XII: midline tongue extension Negative pronator drift Finger to nose exam unremarkable Horizontal nystagmus to left which resolves.   Skin: Skin is warm and dry.  Psychiatric: He has a normal mood and affect. His speech is normal and behavior is normal. Thought content normal.  Nursing note and vitals reviewed.  ED Treatments / Results  Labs (all labs ordered are listed, but only abnormal results are displayed) Labs Reviewed  BASIC METABOLIC PANEL - Abnormal; Notable for the following:       Result Value   Calcium 10.4 (*)    All other components within normal limits  CBC - Abnormal; Notable for the  following:    WBC 3.2 (*)    RBC 5.89 (*)    All other components within normal limits  URINALYSIS, ROUTINE W REFLEX MICROSCOPIC - Abnormal; Notable for the following:    Ketones, ur 20 (*)    All other components within normal limits  CBG MONITORING, ED  CBG MONITORING, ED   EKG  EKG Interpretation None       Radiology No results found.  Procedures Procedures (including critical care time)  Medications Ordered in  ED Medications - No data to display   Initial Impression / Assessment and Plan / ED Course  I have reviewed the triage vital signs and the nursing notes.  Pertinent labs & imaging results that were available during my care of the patient were reviewed by me and considered in my medical decision making (see chart for details).  Final Clinical Impressions(s) / ED Diagnoses  {I have reviewed and evaluated the relevant laboratory values.   {I have reviewed the relevant previous healthcare records.  {I obtained HPI from historian. {Patient discussed with supervising physician.  ED Course:  Assessment: Pt is a 50 y.o. male presents to the Emergency Department today from PCP due to lightheadedness and dizziness x 3 weeks. Saw PCP at that time and Dx shingles due to erythema around ear with lesions suspected. Area resolved with valtrex and short prednisone burst. Pt return 2 weeks ago and found to have Bells Palsy on right side. Started on high dose steroids, which allowed it to resolve. Pt states that last Sunday he was on his steroid dosing regiment when he became nauseated and lightheaded. One Episode of emesis. Noted dizziness and continues and weakness. . On exam, pt in NAD. Nontoxic/nonseptic appearing. VSS. Afebrile. Lungs CTA. Heart RRR. Abdomen nontender soft. neurologically intact. Cranial nerve exam unremrkable. Labs reassuring. Given ativan in ED. Pt has been taking meclizine on and off. Discussed with attending provider. Likely peripheral vertigo. Continue meclizine TID. Plan is to DC home with follow up to neurology per wife who was making appointments. At time of discharge, Patient is in no acute distress. Vital Signs are stable. Patient is able to ambulate. Patient able to tolerate PO.   Disposition/Plan:  DC Home Additional Verbal discharge instructions given and discussed with patient.  Pt Instructed to f/u with PCP in the next week for evaluation and treatment of symptoms. Return  precautions given Pt acknowledges and agrees with plan  Supervising Physician Laurence Spates, MD  Final diagnoses:  Peripheral vertigo involving right ear    New Prescriptions New Prescriptions   No medications on file     Audry Pili, PA-C 03/03/17 1601    Laurence Spates, MD 03/03/17 [phone]

## 2017-03-03 NOTE — ED Notes (Signed)
Pt is in stable condition upon d/c and is escorted from ED via wheelchair. 

## 2017-03-03 NOTE — ED Triage Notes (Signed)
Pt is here after being referred by MD.  A couple of weeks ago pt had become light headed and dizzy, saw PMD, work up then next day had redness to right ear and was diagnosed with Shingles, none now, then developed bell's palsy to right eye and something weird with left side of mouth.  Was started on prednisone for 3 days and then started vomiting.  Since continues to feel dizzy and wobbly and weak all over.  Pt has no facial deficits, no extremity weakness or drifts. Pt reports no appetite and just does not feel well.

## 2017-03-17 ENCOUNTER — Encounter: Payer: Self-pay | Admitting: Physical Therapy

## 2017-03-17 ENCOUNTER — Ambulatory Visit: Payer: BLUE CROSS/BLUE SHIELD | Attending: Family Medicine | Admitting: Physical Therapy

## 2017-03-17 DIAGNOSIS — R42 Dizziness and giddiness: Secondary | ICD-10-CM | POA: Diagnosis present

## 2017-03-17 DIAGNOSIS — R262 Difficulty in walking, not elsewhere classified: Secondary | ICD-10-CM

## 2017-03-17 DIAGNOSIS — R2681 Unsteadiness on feet: Secondary | ICD-10-CM | POA: Insufficient documentation

## 2017-03-17 NOTE — Patient Instructions (Signed)
Gaze Stabilization - Tip Card  1.Target must remain in focus, not blurry, and appear stationary while head is in motion. 2.Perform exercises with small head movements (45 to either side of midline). 3.Increase speed of head motion so long as target is in focus. 4.If you wear eyeglasses, be sure you can see target through lens (therapist will give specific instructions for bifocal / progressive lenses). 5.These exercises may provoke dizziness or nausea. Work through these symptoms. If too dizzy, slow head movement slightly. Rest between each exercise. 6.Exercises demand concentration; avoid distractions. 7.For safety, perform standing exercises close to a counter, wall, corner, or next to someone.  Copyright  VHI. All rights reserved.   Gaze Stabilization - Standing Feet Apart   Feet shoulder width apart, keeping eyes on target on wall 3 feet away, tilt head down slightly and move head side to side for 30 seconds. Repeat while moving head up and down for 30 seconds. *Work up to tolerating 60 seconds, as able. Do 2-3 sessions per day.   Copyright  VHI. All rights reserved.    

## 2017-03-17 NOTE — Therapy (Signed)
[facility] [address] [place], [place], [address] Phone: [phone]   Fax:  [phone]  Physical Therapy Evaluation  Patient Details  Name: Timothy Vincent MRN: [id number] Date of Birth: November 14, 1966 Referring Provider: Darrow Bussing, MD  Encounter Date: 03/17/2017      PT End of Session - 03/17/17 0902    Visit Number 1   Number of Visits 6   Date for PT Re-Evaluation 04/24/17   Authorization Type BCBS   PT Start Time 0800   PT Stop Time 0845   PT Time Calculation (min) 45 min   Activity Tolerance Patient tolerated treatment well   Behavior During Therapy [facility] for tasks assessed/performed      History reviewed. No pertinent past medical history.  Past Surgical History:  Procedure Laterality Date  . KNEE SURGERY      There were no vitals filed for this visit.       Subjective Assessment - 03/17/17 0804    Subjective Pt presents to OPPT s/p episode of shingles behind R ear 5 weeks ago that led to facial weakness and Bell's Palsy-completed prescription of Valtrex and prednisone.  Prednisone caused nausea and emesis-pt ceased taking Prednisone without wean per MD orders.  After stopping Prednisone pt experienced first episode of room spinning vertigo that occured every day for about 2-3 weeks; pt was out of work due to dizziness.  Pt reports a residual feeling of "off balance" during gait and changes directions.   Pertinent History None   Limitations Walking   Patient Stated Goals Get back to 100%; would like to get back to cycling-3x/week up to 50 miles   Currently in Pain? 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[employer]; was having issues with reading and looking at computer screen      Observation/Other Assessments   Focus on Therapeutic Outcomes (FOTO)  73 (27% limited, 11% predicted limitation)   Other Surveys  Other Surveys   Dizziness Handicap Inventory [facility])  26 mild impairment            Vestibular Assessment - 03/17/17 0812      Vestibular Assessment   General Observation Guarded during gait; LOB with quick turns and changes in direction     Symptom Behavior   Type of Dizziness "Funny feeling in head"  true vertigo has resolved   Frequency of Dizziness daily   Duration of Dizziness constant   Aggravating Factors Turning body quickly;Turning head quickly;Activity in general   Relieving Factors Lying supine     Occulomotor Exam   Occulomotor Alignment Normal   Spontaneous Absent   Gaze-induced Absent   Smooth Pursuits Intact   Saccades Slow   Comment Convergence intact     Vestibulo-Occular Reflex   VOR to Slow Head Movement Normal   VOR Cancellation Normal   Comment HIT: + R, - to L     Visual Acuity   Static 8   Dynamic 2     Positional Testing   Dix-Hallpike Dix-Hallpike Right;Dix-Hallpike Left   Horizontal Canal Testing Horizontal Canal Right;Horizontal Canal Left  Dix-Hallpike Right   Dix-Hallpike Right Duration 0   Dix-Hallpike Right Symptoms No nystagmus     Dix-Hallpike Left   Dix-Hallpike Left Duration 0   Dix-Hallpike Left Symptoms No nystagmus     Horizontal Canal Right   Horizontal Canal Right Duration 0   Horizontal Canal Right Symptoms Normal     Horizontal Canal Left   Horizontal Canal Left Duration 0   Horizontal Canal Left Symptoms Normal                Vestibular Treatment/Exercise - 03/17/17 0855      Vestibular Treatment/Exercise   Vestibular Treatment Provided Gaze   Gaze Exercises X1 Viewing Horizontal;X1 Viewing Vertical     X1 Viewing Horizontal   Foot Position feet apart, feet  together   Reps 2   Comments cues for increased speed, symptoms increased to 1-2/10 overall after 30 seconds     X1 Viewing Vertical   Foot Position feet apart, feet together   Reps 2   Comments able to tolerate 45 seconds at faster speeds with mild increase in symptoms               PT Education - 03/17/17 0901    Education provided Yes   Education Details clinical findings, vestibular hypofunction, PT POC, goals, x 1 viewing HEP, walking program, recommendations for exercising at the gym   Person(s) Educated Patient   Methods Explanation;Demonstration;Handout   Comprehension Verbalized understanding;Returned demonstration             PT Long Term Goals - 03/17/17 0912      PT LONG TERM GOAL #1   Title (TARGET DATE FOR ALL LTG 04/24/2017)  Pt will participate in FGA with LTG to be set after second visit   Time 5   Period Weeks   Status New     PT LONG TERM GOAL #2   Title Pt will demonstrate independence with vestibular and balance HEP   Time 5   Period Weeks     PT LONG TERM GOAL #3   Title Pt wil demonstrate improved gaze stability as indicated by <4 line difference on DVA   Baseline 6 line difference    Time 5   Period Weeks   Status New     PT LONG TERM GOAL #4   Title Pt will improve confidence with daily activities as indicated by a decrease in DHI score to 8   Baseline 26   Time 5   Period Weeks     PT LONG TERM GOAL #5   Title Pt will report being able to cycle 3/7 days up to 20-25 miles with no LOB during head turns.   Baseline not currently cycling   Time 5   Period Weeks   Status New               Plan - 03/17/17 0905    Clinical Impression Statement Pt is a 51 year old male presenting to OPPT neuro for low complexity PT evaluation for vertigo and imbalance following episode of shingles affecting his 7th and 8th cranial nerve with Bell's Palsy.  Bell's Palsy has resolved.  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  Pt would benefit from skilled PT to address these impairments and functional limitations to maximize functional mobility independence and reduce falls risk.   Rehab Potential Excellent   PT Frequency 1x /  week   PT Duration Other (comment)  5 weeks   PT Treatment/Interventions ADLs/Self Care Home Management;Canalith Repostioning;Gait training;Functional mobility training;Therapeutic activities;Therapeutic exercise;Balance training;Neuromuscular re-education;Patient/family education;Vestibular   PT Next Visit Plan review x 1 viewing and progress, assess FGA/set LTG.  Balance with compliant surfaces, narrow BOS.  Pt enjoys cycling-how simulate sitting on bike?   Consulted and Agree with Plan of Care Patient      Patient will benefit from skilled therapeutic intervention in order to improve the following deficits and impairments:  Decreased balance, Dizziness, Difficulty walking  Visit Diagnosis: Dizziness and giddiness  Unsteadiness on feet  Difficulty in walking, not elsewhere classified     Problem List There are no active problems to display for this patient.  Edman Circle, PT, DPT 03/17/17    9:19 AM    [facility] [address] [place], [place], [address] Phone: [phone]   Fax:  [phone]  Name: Timothy Vincent MRN: [id number] Date of Birth: [birth year]

## 2017-03-25 ENCOUNTER — Ambulatory Visit: Payer: BLUE CROSS/BLUE SHIELD | Attending: Family Medicine | Admitting: Physical Therapy

## 2017-03-25 ENCOUNTER — Encounter: Payer: Self-pay | Admitting: Physical Therapy

## 2017-03-25 DIAGNOSIS — R42 Dizziness and giddiness: Secondary | ICD-10-CM

## 2017-03-25 DIAGNOSIS — R262 Difficulty in walking, not elsewhere classified: Secondary | ICD-10-CM

## 2017-03-25 DIAGNOSIS — R2681 Unsteadiness on feet: Secondary | ICD-10-CM

## 2017-03-25 NOTE — Patient Instructions (Addendum)
Feet Partial Heel-Toe, Head Motion - Eyes Closed   With eyes closed and right foot partially in front of the other, move head slowly, up and down 10 times and then side to side 10 times.  Switch feet and repeat.  Do 2 sessions per day.  Feet Together (Compliant Surface) Head Motion - Eyes Closed    Stand on compliant surface: bag of pillows with feet together. Close eyes and move head slowly, up and down 10 times, side to side 10 times.  Do 2 sessions per day.   AMBULATION: Walk Forwards and Backward with eyes closed   Walk Forwards and then backwards with eyes closed. Take large steps, do not drag feet.  Stand next to counter top so you know where to stop and start. Repeat 4 times; 2 sets per day  Feet Heel-Toe "Tandem"    Arms by your side, counter top on R side, walk a straight line bringing one foot directly in front of the other.  Forwards and then backwards. Repeat 4 times. Do __2__ sessions per day.     Random Direction Head Motion    Walking on solid surface, move head and eyes in random directions every _2-3___ steps forwards and then backwards. Repeat sequence __4__ times per session. Do __2__ sessions per day. Repeat in dimly lit room.

## 2017-03-25 NOTE — Therapy (Signed)
[facility] [address] [place], [place], [address] Phone: [phone]   Fax:  [phone]  Physical Therapy Treatment  Patient Details  Name: Timothy Vincent MRN: [id number] Date of Birth: 22-Apr-1966 Referring Provider: Darrow Bussing, MD  Encounter Date: 03/25/2017      PT End of Session - 03/25/17 0909    Visit Number 2   Number of Visits 6   Date for PT Re-Evaluation 04/24/17   Authorization Type BCBS   PT Start Time 0803   PT Stop Time 0845   PT Time Calculation (min) 42 min   Activity Tolerance Patient tolerated treatment well   Behavior During Therapy [facility] for tasks assessed/performed      History reviewed. No pertinent past medical history.  Past Surgical History:  Procedure Laterality Date  . KNEE SURGERY      There were no vitals filed for this visit.      Subjective Assessment - 03/25/17 0807    Subjective Pt returns with no issues, has been performing x 1 viewing with feet apart, feet together on solid surface and then on compliant surface.  Side to side still less clear than up/down.   Pertinent History None   Limitations Walking   Patient Stated Goals Get back to 100%; would like to get back to cycling-3x/week up to 50 miles   Currently in Pain? No/denies            [facility] PT Assessment - 03/25/17 0809      Functional Gait  Assessment   Gait assessed  Yes   Gait Level Surface Walks 20 ft in less than 5.5 sec, no assistive devices, good speed, no evidence for imbalance, normal gait pattern, deviates no more than 6 in outside of the 12 in walkway width.   Change in Gait Speed Able to smoothly change walking speed without loss of balance or gait deviation. Deviate no more than 6 in outside of the 12 in walkway width.   Gait with Horizontal Head Turns Performs head turns smoothly with slight change in gait velocity (eg, minor disruption to smooth gait path), deviates 6-10 in outside 12 in  walkway width, or uses an assistive device.   Gait with Vertical Head Turns Performs head turns with no change in gait. Deviates no more than 6 in outside 12 in walkway width.   Gait and Pivot Turn Pivot turns safely within 3 sec and stops quickly with no loss of balance.   Step Over Obstacle Is able to step over 2 stacked shoe boxes taped together (9 in total height) without changing gait speed. No evidence of imbalance.   Gait with Narrow Base of Support Ambulates 7-9 steps.   Gait with Eyes Closed Walks 20 ft, slow speed, abnormal gait pattern, evidence for imbalance, deviates 10-15 in outside 12 in walkway width. Requires more than 9 sec to ambulate 20 ft.   Ambulating Backwards Walks 20 ft, uses assistive device, slower speed, mild gait deviations, deviates 6-10 in outside 12 in walkway width.   Steps Alternating feet, no rail.   Total Score 25   FGA comment: 25/30                          Balance Exercises - 03/25/17 0855      Balance Exercises: Standing   Standing Eyes Closed Narrow base of support (BOS);Head turns;Foam/compliant surface;Solid surface;Other reps (comment)  tandem on solid surface, feet together compliant surface  Gait with Head Turns Forward;Retro;2 reps  vertical and horizontal head turns   Tandem Gait Forward;Retro;Intermittent upper extremity support;2 reps   Retro Gait 2 reps;Other (comment);Upper extremity support  forwards and retro with eyes closed   Other Standing Exercises For corner balance exercises: pt performed tandem with feet on solid surface with 10 reps head turns vertical and horizontal. Performed compliant surface with feet together and 10 reps head turns.  Both performed with eyes closed.           PT Education - 03/25/17 0908    Education provided Yes   Education Details FGA score, conditions that may put pt at risk for falls, balance HEP   Person(s) Educated Patient   Methods Explanation;Demonstration;Handout    Comprehension Verbalized understanding             PT Long Term Goals - 03/25/17 0909      PT LONG TERM GOAL #1   Title (TARGET DATE FOR ALL LTG 04/24/2017) Pt will improve safety and balance with dynamic gait as indicated by FGA score of >27/30   Baseline 25/30   Time 5   Period Weeks   Status Revised     PT LONG TERM GOAL #2   Title Pt will demonstrate independence with vestibular and balance HEP   Time 5   Period Weeks     PT LONG TERM GOAL #3   Title Pt wil demonstrate improved gaze stability as indicated by <4 line difference on DVA   Baseline 6 line difference    Time 5   Period Weeks   Status New     PT LONG TERM GOAL #4   Title Pt will improve confidence with daily activities as indicated by a decrease in DHI score to 8   Baseline 26   Time 5   Period Weeks     PT LONG TERM GOAL #5   Title Pt will report being able to cycle 3/7 days up to 20-25 miles with no LOB during head turns.   Baseline not currently cycling   Time 5   Period Weeks   Status New               Plan - 03/25/17 0911    Clinical Impression Statement Treatment session today with focus on assessment of falls risk during dynamic gait tasks with FGA assessment.  Pt is above cut off score for increased falls risk but pt did have increased difficulty maintaining balance with head turns, narrow BOS, eyes closed and backwards walking.  Discussed how this may carryover to maintaining balance while cycling.  Incorporated eyes closed, head turns, tandem gait and retro gait into balance HEP.  Pt tolerated well but did feel slightly off balance after return demonstrating exercises.  Will continue to address and progress.   Rehab Potential Excellent   PT Treatment/Interventions ADLs/Self Care Home Management;Canalith Repostioning;Gait training;Functional mobility training;Therapeutic activities;Therapeutic exercise;Balance training;Neuromuscular re-education;Patient/family education;Vestibular   PT  Next Visit Plan Progress x 1 viewing; Balance with compliant surfaces, narrow BOS, decreased vision.  Pt enjoys cycling-how simulate sitting on bike?   Consulted and Agree with Plan of Care Patient      Patient will benefit from skilled therapeutic intervention in order to improve the following deficits and impairments:  Decreased balance, Dizziness, Difficulty walking  Visit Diagnosis: Dizziness and giddiness  Unsteadiness on feet  Difficulty in walking, not elsewhere classified     Problem List There are no active problems to display for this  patient.   Edman Circle, PT, DPT 03/25/17    9:17 AM    [facility] [address] [place], [place], [address] Phone: [phone]   Fax:  [phone]  Name: Timothy Vincent MRN: [id number] Date of Birth: 07-Feb-1966

## 2017-03-31 ENCOUNTER — Ambulatory Visit: Payer: BLUE CROSS/BLUE SHIELD | Admitting: Physical Therapy

## 2017-03-31 DIAGNOSIS — R2681 Unsteadiness on feet: Secondary | ICD-10-CM

## 2017-03-31 DIAGNOSIS — R42 Dizziness and giddiness: Secondary | ICD-10-CM | POA: Diagnosis not present

## 2017-03-31 DIAGNOSIS — R262 Difficulty in walking, not elsewhere classified: Secondary | ICD-10-CM

## 2017-03-31 NOTE — Therapy (Signed)
[facility] [address] [place], [place], [address] Phone: [phone]   Fax:  [phone]  Physical Therapy Treatment  Patient Details  Name: Timothy Vincent MRN: [id number] Date of Birth: Oct 23, 1966 Referring Provider: Darrow Bussing, MD  Encounter Date: 03/31/2017      PT End of Session - 03/31/17 0847    Visit Number 3   Number of Visits 6   Date for PT Re-Evaluation 04/24/17   Authorization Type BCBS   PT Start Time 0805   PT Stop Time 0844   PT Time Calculation (min) 39 min   Activity Tolerance Patient tolerated treatment well   Behavior During Therapy [facility] for tasks assessed/performed      No past medical history on file.  Past Surgical History:  Procedure Laterality Date  . KNEE SURGERY      There were no vitals filed for this visit.      Subjective Assessment - 03/31/17 0808    Subjective Pt reports some of the balance exercises with eyes closed and compliant surfaces are still very difficult.  Has increased x1 viewing to 45 seconds, no issues.   Pertinent History None   Limitations Walking   Patient Stated Goals Get back to 100%; would like to get back to cycling-3x/week up to 50 miles   Currently in Pain? No/denies                          Vestibular Treatment/Exercise - 03/31/17 0809      Vestibular Treatment/Exercise   Vestibular Treatment Provided Gaze   Gaze Exercises X1 Viewing Horizontal;X1 Viewing Vertical     X1 Viewing Horizontal   Foot Position partial tandem, R then L forward on pillow; sitting on physioball one foot lifted   Reps 4     X1 Viewing Vertical   Foot Position partial tandem, R then L forward on pillow; sitting on physioball one foot lifted   Reps 4            Balance Exercises - 03/31/17 0842      Balance Exercises: Standing   Tandem Stance Eyes open  on rockerboard, R and L foot forwards, diagonal head turns   Rockerboard  Anterior/posterior;Lateral;Head turns;EO;EC  EO with head nods/turns, EC head still           PT Education - 03/31/17 0846    Education provided Yes   Education Details change x 1 viewing to tandem stance on pillows, increase time to 1:30-2 min.  Add in stationary bike for aerobic with head turns.   Person(s) Educated Patient   Methods Explanation   Comprehension Verbalized understanding             PT Long Term Goals - 03/25/17 0909      PT LONG TERM GOAL #1   Title (TARGET DATE FOR ALL LTG 04/24/2017) Pt will improve safety and balance with dynamic gait as indicated by FGA score of >27/30   Baseline 25/30   Time 5   Period Weeks   Status Revised     PT LONG TERM GOAL #2   Title Pt will demonstrate independence with vestibular and balance HEP   Time 5   Period Weeks     PT LONG TERM GOAL #3   Title Pt wil demonstrate improved gaze stability as indicated by <4 line difference on DVA   Baseline 6 line difference    Time 5   Period Weeks  Status New     PT LONG TERM GOAL #4   Title Pt will improve confidence with daily activities as indicated by a decrease in DHI score to 8   Baseline 26   Time 5   Period Weeks     PT LONG TERM GOAL #5   Title Pt will report being able to cycle 3/7 days up to 20-25 miles with no LOB during head turns.   Baseline not currently cycling   Time 5   Period Weeks   Status New               Plan - 03/31/17 0934    Clinical Impression Statement Treatment session today with continued focus on progression of x 1 viewing and addition of sitting on compliant surface to simulate sitting on bicycle; also progressed weight shifting and dynamic standing balance to rocker board with head turns and vision removed.  Pt tolerated additional challenges well and continues to have increased difficulty with vision removed.  Pt to add aerobic exercise on stationary bike to HEP and add in intermittent head turns.   Rehab Potential Excellent    PT Treatment/Interventions ADLs/Self Care Home Management;Canalith Repostioning;Gait training;Functional mobility training;Therapeutic activities;Therapeutic exercise;Balance training;Neuromuscular re-education;Patient/family education;Vestibular   PT Next Visit Plan Incorporate treadmill with head turns, sitting on physioball and doing head turns/x 1 viewing to simulate sitting on bicycle, balance exercises on Bosu   Consulted and Agree with Plan of Care Patient      Patient will benefit from skilled therapeutic intervention in order to improve the following deficits and impairments:  Decreased balance, Dizziness, Difficulty walking  Visit Diagnosis: Dizziness and giddiness  Unsteadiness on feet  Difficulty in walking, not elsewhere classified     Problem List There are no active problems to display for this patient.  Edman CircleAudra Hall, PT, DPT 03/31/17    9:40 AM    [facility] [address] [place], [place], [address] Phone: [phone]   Fax:  [phone]  Name: Jones Skeneherence Poffenberger MRN: [id number] Date of Birth: 1966-09-20

## 2017-04-08 ENCOUNTER — Ambulatory Visit: Payer: BLUE CROSS/BLUE SHIELD

## 2017-04-08 DIAGNOSIS — R42 Dizziness and giddiness: Secondary | ICD-10-CM

## 2017-04-08 DIAGNOSIS — B0221 Postherpetic geniculate ganglionitis: Secondary | ICD-10-CM | POA: Diagnosis not present

## 2017-04-08 DIAGNOSIS — R262 Difficulty in walking, not elsewhere classified: Secondary | ICD-10-CM

## 2017-04-08 DIAGNOSIS — R2681 Unsteadiness on feet: Secondary | ICD-10-CM

## 2017-04-08 NOTE — Therapy (Signed)
[facility] [address] [place], [place], [address] Phone: [phone]   Fax:  [phone]  Physical Therapy Treatment  Patient Details  Name: Timothy Vincent MRN: [id number] Date of Birth: 04/09/1966 Referring Provider: Darrow Bussing, MD  Encounter Date: 04/08/2017      PT End of Session - 04/08/17 1017    Visit Number 4   Number of Visits 6   Date for PT Re-Evaluation 04/24/17   Authorization Type BCBS   PT Start Time 0803   PT Stop Time 0842   PT Time Calculation (min) 39 min   Equipment Utilized During Treatment Gait belt   Activity Tolerance Patient tolerated treatment well   Behavior During Therapy [facility] for tasks assessed/performed      History reviewed. No pertinent past medical history.  Past Surgical History:  Procedure Laterality Date  . KNEE SURGERY      There were no vitals filed for this visit.      Subjective Assessment - 04/08/17 0806    Subjective Pt denied incr. in dizziness since last visit. Pt reported staggered stance x1 viewing HEP is easy and he can perform for two minutes. Pt also [place] (with incr. stimuli) incr. unsteadinesss.    Pertinent History None   Patient Stated Goals Get back to 100%; would like to get back to cycling-3x/week up to 50 miles   Currently in Pain? No/denies                         [facility] Adult PT Treatment/Exercise - 04/08/17 0807      Ambulation/Gait   Ambulation/Gait Yes   Ambulation/Gait Assistance 5: Supervision;4: Min guard   Ambulation/Gait Assistance Details Neuro re-ed: Pt amb. on treadmill while performing head turns, pt with narrow BOS and LOB (corrected with UE on treadmill bars and stepping strategy) while performing head turns with each step. No LOB during head turns in all directions every 3 steps. Performed at 2.0-2.87mph for 6.5 minutes. Cues to improve narrow BOS. 0.24 miles   Ambulation Distance  (Feet) --  0.67miles   Assistive device Other (Comment)  treadmill   Gait Pattern Step-through pattern;Narrow base of support   Ambulation Surface Level;Indoor  treadmill             Balance Exercises - 04/08/17 0846      Balance Exercises: Standing   Other Standing Exercises In //bars standing on bosu ball with intermittent UE support and S-min A for safety and to maintain balance: head turns, feet apart with eyes open (30-60 sec.) x2 reps and eyes closed (10-20 sec.) 2x5 reps, head turns in all directions (2x5 reps). Cues and demo for technique.      Balance Exercises: Seated   Dynamic Sitting Eyes opened;No upper extremity support;Other (comment)   Other Seated Exercises Seated on blue physioball performing gaze stabilization for 30 sec. in static sitting on ball, bouncing on ball, and performing B hip marches on ball: all for 30 sec. Cues for technique and performed with S for safety.           PT Education - 04/08/17 1012    Education provided Yes   Education Details PT had pt progress from x1 viewing modified (staggered) tandem stance on pillows to tandem stance on pillows for 30 sec., as pt reported he can perform modified for 2 minutes.   Person(s) Educated Patient   Methods Explanation   Comprehension Verbalized understanding  PT Long Term Goals - 03/25/17 0909      PT LONG TERM GOAL #1   Title (TARGET DATE FOR ALL LTG 04/24/2017) Pt will improve safety and balance with dynamic gait as indicated by FGA score of >27/30   Baseline 25/30   Time 5   Period Weeks   Status Revised     PT LONG TERM GOAL #2   Title Pt will demonstrate independence with vestibular and balance HEP   Time 5   Period Weeks     PT LONG TERM GOAL #3   Title Pt wil demonstrate improved gaze stability as indicated by <4 line difference on DVA   Baseline 6 line difference    Time 5   Period Weeks   Status New     PT LONG TERM GOAL #4   Title Pt will improve  confidence with daily activities as indicated by a decrease in DHI score to 8   Baseline 26   Time 5   Period Weeks     PT LONG TERM GOAL #5   Title Pt will report being able to cycle 3/7 days up to 20-25 miles with no LOB during head turns.   Baseline not currently cycling   Time 5   Period Weeks   Status New               Plan - 04/08/17 1018    Clinical Impression Statement Pt demonstrated progress today, as he was able to tolerated gait on treadmill with head turns, seated on physioball performing x1 viewing activities, and during bosu ball activities. Pt continues to experience incr. postural sway and LOB during dynamic gait and balance activities that require incr. vestibular input. Therefore, pt would continue to benefit from skilled PT to improve safety during functional mobility.    Rehab Potential Excellent   PT Treatment/Interventions ADLs/Self Care Home Management;Canalith Repostioning;Gait training;Functional mobility training;Therapeutic activities;Therapeutic exercise;Balance training;Neuromuscular re-education;Patient/family education;Vestibular   PT Next Visit Plan Continue to incorporate treadmill with head turns (on incline/decline), gait training with incr. stimuli to address motion sensitivity during gait, sitting on physioball and doing head turns/x 1 viewing to simulate sitting on bicycle, balance exercises on Bosu   Consulted and Agree with Plan of Care Patient      Patient will benefit from skilled therapeutic intervention in order to improve the following deficits and impairments:  Decreased balance, Dizziness, Difficulty walking  Visit Diagnosis: Dizziness and giddiness  Unsteadiness on feet  Difficulty in walking, not elsewhere classified     Problem List There are no active problems to display for this patient.   Ellieanna Funderburg L 04/08/2017, 10:25 AM  [place] [facility] [address] [place], [place], [address] Phone: [phone]   Fax:  [phone]  Name: Timothy Vincent MRN: [id number] Date of Birth: 1966/10/08  Zerita BoersJennifer Breck [place], PT,DPT 04/08/17 10:26 AM Phone: [phone] Fax: [phone]

## 2017-04-15 ENCOUNTER — Encounter: Payer: Self-pay | Admitting: Physical Therapy

## 2017-04-15 ENCOUNTER — Ambulatory Visit: Payer: BLUE CROSS/BLUE SHIELD | Admitting: Physical Therapy

## 2017-04-15 DIAGNOSIS — R262 Difficulty in walking, not elsewhere classified: Secondary | ICD-10-CM

## 2017-04-15 DIAGNOSIS — R42 Dizziness and giddiness: Secondary | ICD-10-CM

## 2017-04-15 DIAGNOSIS — R2681 Unsteadiness on feet: Secondary | ICD-10-CM

## 2017-04-15 NOTE — Therapy (Signed)
[facility] [address] [place], [place], [address] Phone: [phone]   Fax:  [phone]  Physical Therapy Treatment  Patient Details  Name: Timothy Vincent MRN: [id number] Date of Birth: September 03, 1966 Referring Provider: Darrow Bussing, MD  Encounter Date: 04/15/2017      PT End of Session - 04/15/17 1628    Visit Number 5   Number of Visits 6   Date for PT Re-Evaluation 04/24/17   Authorization Type BCBS   PT Start Time 1536   PT Stop Time 1616   PT Time Calculation (min) 40 min   Activity Tolerance Patient tolerated treatment well   Behavior During Therapy [facility] for tasks assessed/performed      History reviewed. No pertinent past medical history.  Past Surgical History:  Procedure Laterality Date  . KNEE SURGERY      There were no vitals filed for this visit.      Subjective Assessment - 04/15/17 1537    Subjective Pt reports disequilibrium continues to improve; still notices that when he is walking (especially when walking next to someone) he will veer off of a straight path intermittently.  Has been able to go back to cycling 3 times, one time with group, twice alone-32 miles was longest ride; has had some veering when having to look back and then forwards quickly.  Was very fatigued at the end of the ride.   Pertinent History None   Limitations Walking   Patient Stated Goals Get back to 100%; would like to get back to cycling-3x/week up to 50 miles   Currently in Pain? No/denies                          Vestibular Treatment/Exercise - 04/15/17 1542      Vestibular Treatment/Exercise   Vestibular Treatment Provided Gaze     X1 Viewing Horizontal   Foot Position gait forwards and retro x 25' x 4 reps open path and then through wide>narrow cones and then marching on ramp with feet facing up and then down   Comments 1-2 minutes at a time     X1 Viewing Vertical   Foot Position  gait forwards and retro x 25' x 4 reps open path and then through wide>narrow cones and then marching on ramp with feet facing up and then down   Comments 1-2 minutes at a time            Balance Exercises - 04/15/17 1625      Balance Exercises: Standing   Lift / Chop Limitations side to side chops and R/L diagonal chops with eyes/head following medicine ball standing on balance foam x 10 reps each; changed to standing on BOSU ball performing side to side and up/down chops with medicine ball x 10 reps each with mod A for balance   Other Standing Exercises R and L lunge position while bouncing a ball to L and R of forwards stance LE and bringing ball in arc overhead while eyes and head followed ball to L and R x 10 reps each           PT Education - 04/15/17 1627    Education provided Yes   Education Details progressed x 1 viewing to tandem stance with letter on busy background and walking down hallway forwards and retro holding letter during x 1 viewing   Person(s) Educated Patient   Methods Explanation;Demonstration   Comprehension Verbalized understanding;Returned demonstration  PT Long Term Goals - 03/25/17 0909      PT LONG TERM GOAL #1   Title (TARGET DATE FOR ALL LTG 04/24/2017) Pt will improve safety and balance with dynamic gait as indicated by FGA score of >27/30   Baseline 25/30   Time 5   Period Weeks   Status Revised     PT LONG TERM GOAL #2   Title Pt will demonstrate independence with vestibular and balance HEP   Time 5   Period Weeks     PT LONG TERM GOAL #3   Title Pt wil demonstrate improved gaze stability as indicated by <4 line difference on DVA   Baseline 6 line difference    Time 5   Period Weeks   Status New     PT LONG TERM GOAL #4   Title Pt will improve confidence with daily activities as indicated by a decrease in DHI score to 8   Baseline 26   Time 5   Period Weeks     PT LONG TERM GOAL #5   Title Pt will report being  able to cycle 3/7 days up to 20-25 miles with no LOB during head turns.   Baseline not currently cycling   Time 5   Period Weeks   Status New               Plan - 04/15/17 1628    Clinical Impression Statement Pt continues to make good progress towards LTG and has been able to return to light cycling.  Treatment session today with focus on progression of x 1 viewing gaze adaptation to incorporate more dynamic movement of marching on unlevel surface, gait forwards and retro and with busy background.  Also progressed standing balance exercises to include more dynamic head and body turns on compliant surfaces or with more narrow BOS.  Pt with one more PT visit and will likely be ready to D/C.   Rehab Potential Excellent   PT Treatment/Interventions ADLs/Self Care Home Management;Canalith Repostioning;Gait training;Functional mobility training;Therapeutic activities;Therapeutic exercise;Balance training;Neuromuscular re-education;Patient/family education;Vestibular   PT Next Visit Plan final visit, assess LTG, FOTO and D/C   Consulted and Agree with Plan of Care Patient      Patient will benefit from skilled therapeutic intervention in order to improve the following deficits and impairments:  Decreased balance, Dizziness, Difficulty walking  Visit Diagnosis: Dizziness and giddiness  Unsteadiness on feet  Difficulty in walking, not elsewhere classified     Problem List There are no active problems to display for this patient.  Edman CircleAudra Hall, PT, DPT 04/15/17    4:31 PM    [facility] [address] [place], [place], [address] Phone: [phone]   Fax:  [phone]  Name: Timothy Vincent MRN: [id number] Date of Birth: Sep 04, 1966

## 2017-04-21 ENCOUNTER — Encounter: Payer: Self-pay | Admitting: Physical Therapy

## 2017-04-21 ENCOUNTER — Ambulatory Visit: Payer: BLUE CROSS/BLUE SHIELD | Admitting: Physical Therapy

## 2017-04-21 DIAGNOSIS — R2681 Unsteadiness on feet: Secondary | ICD-10-CM

## 2017-04-21 DIAGNOSIS — R42 Dizziness and giddiness: Secondary | ICD-10-CM

## 2017-04-21 DIAGNOSIS — R262 Difficulty in walking, not elsewhere classified: Secondary | ICD-10-CM

## 2017-04-21 NOTE — Patient Instructions (Addendum)
Feet Partial Heel-Toe, Head Motion - Eyes Closed   With eyes closed and right foot partially in front of the other, move head slowly, up and down 10 times and then side to side 10 times, R and L diagonals 10 times each.  Switch feet and repeat.  Do 2 sessions per day.  Feet Together (Compliant Surface) Head Motion - Eyes Closed    Stand on compliant surface: bag of pillows with feet together. Close eyes and move head slowly, up and down 10 times, side to side 10 times, R and L diagonals 10 times each.  Do 2 sessions per day.   AMBULATION: Walk Forwards and Backward with eyes closed   Walk Forwards and then backwards with eyes closed. Take large steps, do not drag feet.  Stand next to counter top so you know where to stop and start.  Focus on going faster forwards and backwards. Repeat 4 times; 2 sets per day  Feet Heel-Toe "Tandem"    Arms by your side, counter top on R side, walk a straight line bringing one foot directly in front of the other.  Forwards and then backwards over carpet. Repeat 4 times. Do __2__ sessions per day.     Random Direction Head Motion    Walking on solid surface, move head and eyes in random directions, up/down, side to side, diagonal every _2-3___ steps forwards and then backwards. Repeat sequence __4__ times per session. Do __2__ sessions per day. Repeat in dimly lit room or on carpet/outside  Gaze Stabilization - Tip Card  1.Target must remain in focus, not blurry, and appear stationary while head is in motion. 2.Perform exercises with small head movements (45 to either side of midline). 3.Increase speed of head motion so long as target is in focus. 4.If you wear eyeglasses, be sure you can see target through lens (therapist will give specific instructions for bifocal / progressive lenses). 5.These exercises may provoke dizziness or nausea. Work through these symptoms. If too dizzy, slow head movement slightly. Rest between each  exercise. 6.Exercises demand concentration; avoid distractions. 7.For safety, perform standing exercises close to a counter, wall, corner, or next to someone.  Copyright  [employer]. All rights reserved.   Gaze Stabilization - Standing Feet Apart   Feet in heel toe position-right foot forwards, keeping eyes on target on busy background, tilt head down slightly and move head side to side for 60 seconds. Repeat while moving head up and down for 60 seconds.  Switch, put left foot forwards and repeat.  Work up to 2 minutes. Keep letter against busy background; Repeat walking forwards and backwards 3-4 times turning head side to side and then up/down. Do 2 sessions per day.

## 2017-04-21 NOTE — Therapy (Signed)
[facility] [address] [place], [place], [address] Phone: [phone]   Fax:  [phone]  Physical Therapy Treatment and D/C Summary  Patient Details  Name: Timothy Vincent MRN: [id number] Date of Birth: 14-Mar-1966 Referring Provider: Lujean Amel, MD  Encounter Date: 04/21/2017      PT End of Session - 04/21/17 1000    Visit Number 6   Number of Visits 6   Date for PT Re-Evaluation 04/24/17  D/C today   Authorization Type [employer]   PT Start Time 0800   PT Stop Time 0847   PT Time Calculation (min) 47 min   Activity Tolerance Patient tolerated treatment well   Behavior During Therapy [facility] for tasks assessed/performed      History reviewed. No pertinent past medical history.  Past Surgical History:  Procedure Laterality Date  . KNEE SURGERY      There were no vitals filed for this visit.      Subjective Assessment - 04/21/17 0805    Subjective Pt doing well with progressed exercises; still having difficulty with tandem stance.  Reports cycling about 32 miles at the most.  No issues.     Pertinent History None   Limitations Walking   Patient Stated Goals Get back to 100%; would like to get back to cycling-3x/week up to 50 miles   Currently in Pain? No/denies            [facility] PT Assessment - 04/21/17 0806      Observation/Other Assessments   Focus on Therapeutic Outcomes (FOTO)  97 (3% limited)   Dizziness Handicap Inventory (DHI)  6 mild impairment     Functional Gait  Assessment   Gait assessed  Yes   Gait Level Surface Walks 20 ft in less than 5.5 sec, no assistive devices, good speed, no evidence for imbalance, normal gait pattern, deviates no more than 6 in outside of the 12 in walkway width.   Change in Gait Speed Able to smoothly change walking speed without loss of balance or gait deviation. Deviate no more than 6 in outside of the 12 in walkway width.   Gait with Horizontal Head Turns  Performs head turns smoothly with no change in gait. Deviates no more than 6 in outside 12 in walkway width   Gait with Vertical Head Turns Performs head turns with no change in gait. Deviates no more than 6 in outside 12 in walkway width.   Gait and Pivot Turn Pivot turns safely within 3 sec and stops quickly with no loss of balance.   Step Over Obstacle Is able to step over 2 stacked shoe boxes taped together (9 in total height) without changing gait speed. No evidence of imbalance.   Gait with Narrow Base of Support Is able to ambulate for 10 steps heel to toe with no staggering.   Gait with Eyes Closed Walks 20 ft, uses assistive device, slower speed, mild gait deviations, deviates 6-10 in outside 12 in walkway width. Ambulates 20 ft in less than 9 sec but greater than 7 sec.   Ambulating Backwards Walks 20 ft, no assistive devices, good speed, no evidence for imbalance, normal gait   Steps Alternating feet, no rail.   Total Score 29            Vestibular Assessment - 04/21/17 0808      Visual Acuity   Static 7   Dynamic 4  3 line difference  Vestibular Treatment/Exercise - 04/21/17 0956      Vestibular Treatment/Exercise   Vestibular Treatment Provided Gaze   Gaze Exercises X1 Viewing Horizontal;X1 Viewing Vertical     X1 Viewing Horizontal   Foot Position R and L tandem, gait forwards and retro   Reps 4   Comments 1 minute each static tandem with letter on busy background, 4 reps ambulating forwards and retro     X1 Viewing Vertical   Foot Position R and L tandem, gait forwards and retro   Reps 4   Comments 1 minute each static tandem with letter on busy background, 4 reps ambulating forwards and retro            Balance Exercises - 04/21/17 0957      Balance Exercises: Standing   Standing Eyes Closed Narrow base of support (BOS);Head turns;Foam/compliant surface;Other reps (comment)  standing on pillow, head turns/nods/diagonal x 10 each    Tandem Stance Eyes closed;Other reps (comment)  R/L, solid surface, head turns/nods/diagonal x 10 each   Gait with Head Turns Forward;Retro;1 rep  nods, turns, diagonals   Tandem Gait Forward;Retro;1 rep   Other Standing Exercises gait forwards and retro on level surface with eyes closed and supervision      Feet Partial Heel-Toe, Head Motion - Eyes Closed   With eyes closed and right foot partially in front of the other, move head slowly, up and down 10 times and then side to side 10 times, R and L diagonals 10 times each. Switch feet and repeat. Do 2 sessions per day.  Feet Together (Compliant Surface) Head Motion - Eyes Closed    Stand on compliant surface: bag of pillows with feet together. Close eyes and move head slowly, up and down 10 times, side to side 10 times, R and L diagonals 10 times each. Do 2 sessions per day.   AMBULATION: Walk Forwards and Backward with eyes closed   Walk Forwards and then backwards with eyes closed. Take large steps, do not drag feet. Stand next to counter top so you know where to stop and start.  Focus on going faster forwards and backwards. Repeat 4 times; 2 sets per day  Feet Heel-Toe "Tandem"    Arms by your side, counter top on R side, walk a straight line bringing one foot directly in front of the other. Forwards and then backwards over carpet. Repeat 4 times. Do __2__ sessions per day.     Random Direction Head Motion    Walking on solid surface, move head and eyes in random directions, up/down, side to side, diagonal every _2-3___ steps forwards and then backwards. Repeat sequence __4__ times per session. Do __2__ sessions per day. Repeat in dimly lit room or on carpet/outside  Gaze Stabilization - Tip Card  1.Target must remain in focus, not blurry, and appear stationary while head is in motion. 2.Perform exercises with small head movements (45 to either side of midline). 3.Increase speed of head motion so  long as target is in focus. 4.If you wear eyeglasses, be sure you can see target through lens (therapist will give specific instructions for bifocal / progressive lenses). 5.These exercises may provoke dizziness or nausea. Work through these symptoms. If too dizzy, slow head movement slightly. Rest between each exercise. 6.Exercises demand concentration; avoid distractions. 7.For safety, perform standing exercises close to a counter, wall, corner, or next to someone.  Copyright  [employer]. All rights reserved.  Gaze Stabilization - Standing Feet Apart   Feet in  heel toe position-right foot forwards, keeping eyes on target on busy background, tilt head down slightly and move head side to side for 60 seconds. Repeat while moving head up and down for 60 seconds.  Switch, put left foot forwards and repeat.  Work up to 2 minutes. Keep letter against busy background; Repeat walking forwards and backwards 3-4 times turning head side to side and then up/down. Do 2 sessions per day.      PT Education - 04/21/17 1000    Education provided Yes   Education Details progress made, goals achieved, updated HEP for further progress at D/C   Person(s) Educated Patient   Methods Explanation;Demonstration;Handout   Comprehension Verbalized understanding;Returned demonstration             PT Long Term Goals - 04/21/17 1001      PT LONG TERM GOAL #1   Title (TARGET DATE FOR ALL LTG 04/24/2017) Pt will improve safety and balance with dynamic gait as indicated by FGA score of >27/30   Baseline 29/30   Status Achieved     PT LONG TERM GOAL #2   Title Pt will demonstrate independence with vestibular and balance HEP   Status Achieved     PT LONG TERM GOAL #3   Title Pt wil demonstrate improved gaze stability as indicated by <4 line difference on DVA   Baseline 3 line difference   Status Achieved     PT LONG TERM GOAL #4   Title Pt will improve confidence with daily activities as indicated by a  decrease in [place] score to 8   Baseline 6   Status Achieved     PT LONG TERM GOAL #5   Title Pt will report being able to cycle 3/7 days up to 20-25 miles with no LOB during head turns.   Baseline 32 miles   Status Achieved               Plan - 04/21/17 1002    Clinical Impression Statement Pt has made excellent progress and has met all LTG.  Pt is demonstrating improved gaze stability, decreased disequilibrium, improved balance and stability during dynamic gait and balance activities and has returned to cycling >30 miles individually and with a group without LOB.  Pt's [place] decreased significantly to 8/100.  Reviewed all HEP exercises and upgraded as appropriate to continue to make progress.  Pt safe and ready for D/C from therapy.   Rehab Potential Excellent   PT Frequency 1x / week   PT Duration Other (comment)   PT Treatment/Interventions ADLs/Self Care Home Management;Canalith Repostioning;Gait training;Functional mobility training;Therapeutic activities;Therapeutic exercise;Balance training;Neuromuscular re-education;Patient/family education;Vestibular   PT Next Visit Plan D/C today   Consulted and Agree with Plan of Care Patient      Patient will benefit from skilled therapeutic intervention in order to improve the following deficits and impairments:  Decreased balance, Dizziness, Difficulty walking  Visit Diagnosis: Dizziness and giddiness  Unsteadiness on feet  Difficulty in walking, not elsewhere classified     Problem List There are no active problems to display for this patient.  PHYSICAL THERAPY DISCHARGE SUMMARY  Visits from Start of Care: 6  Current functional level related to goals / functional outcomes: All goals met; see above   Remaining deficits: Mild disequilibrium and impaired dynamic balance   Education / Equipment: HEP  Plan: Patient agrees to discharge.  Patient goals were met. Patient is being discharged due to meeting the stated rehab  goals.  ?????  Raylene Everts, PT, DPT 04/21/17    10:09 AM    [facility] [address] [place], [place], [address] Phone: [phone]   Fax:  [phone]  Name: Ruthvik Barnaby MRN: [id number] Date of Birth: 29-Jun-1966

## 2017-06-06 DIAGNOSIS — H524 Presbyopia: Secondary | ICD-10-CM | POA: Diagnosis not present

## 2017-06-06 DIAGNOSIS — H5213 Myopia, bilateral: Secondary | ICD-10-CM | POA: Diagnosis not present

## 2017-06-06 DIAGNOSIS — H31001 Unspecified chorioretinal scars, right eye: Secondary | ICD-10-CM | POA: Diagnosis not present

## 2017-06-06 DIAGNOSIS — H04123 Dry eye syndrome of bilateral lacrimal glands: Secondary | ICD-10-CM | POA: Diagnosis not present

## 2017-09-05 DIAGNOSIS — Z0000 Encounter for general adult medical examination without abnormal findings: Secondary | ICD-10-CM | POA: Diagnosis not present

## 2017-09-05 DIAGNOSIS — Z23 Encounter for immunization: Secondary | ICD-10-CM | POA: Diagnosis not present

## 2017-10-04 DIAGNOSIS — N401 Enlarged prostate with lower urinary tract symptoms: Secondary | ICD-10-CM | POA: Diagnosis not present

## 2017-10-04 DIAGNOSIS — R350 Frequency of micturition: Secondary | ICD-10-CM | POA: Diagnosis not present

## 2018-01-09 DIAGNOSIS — Z1211 Encounter for screening for malignant neoplasm of colon: Secondary | ICD-10-CM | POA: Diagnosis not present

## 2018-01-09 DIAGNOSIS — Q438 Other specified congenital malformations of intestine: Secondary | ICD-10-CM | POA: Diagnosis not present

## 2018-06-09 DIAGNOSIS — H52203 Unspecified astigmatism, bilateral: Secondary | ICD-10-CM | POA: Diagnosis not present

## 2018-06-09 DIAGNOSIS — H524 Presbyopia: Secondary | ICD-10-CM | POA: Diagnosis not present

## 2018-06-09 DIAGNOSIS — H5213 Myopia, bilateral: Secondary | ICD-10-CM | POA: Diagnosis not present

## 2018-09-22 DIAGNOSIS — Z0000 Encounter for general adult medical examination without abnormal findings: Secondary | ICD-10-CM | POA: Diagnosis not present

## 2018-09-22 DIAGNOSIS — Z125 Encounter for screening for malignant neoplasm of prostate: Secondary | ICD-10-CM | POA: Diagnosis not present

## 2018-09-22 DIAGNOSIS — Z136 Encounter for screening for cardiovascular disorders: Secondary | ICD-10-CM | POA: Diagnosis not present

## 2019-06-12 DIAGNOSIS — H04123 Dry eye syndrome of bilateral lacrimal glands: Secondary | ICD-10-CM | POA: Diagnosis not present

## 2019-06-12 DIAGNOSIS — H5213 Myopia, bilateral: Secondary | ICD-10-CM | POA: Diagnosis not present

## 2019-06-12 DIAGNOSIS — H52203 Unspecified astigmatism, bilateral: Secondary | ICD-10-CM | POA: Diagnosis not present

## 2019-10-02 DIAGNOSIS — Z0000 Encounter for general adult medical examination without abnormal findings: Secondary | ICD-10-CM | POA: Diagnosis not present

## 2019-10-02 DIAGNOSIS — Z125 Encounter for screening for malignant neoplasm of prostate: Secondary | ICD-10-CM | POA: Diagnosis not present

## 2019-10-02 DIAGNOSIS — E7800 Pure hypercholesterolemia, unspecified: Secondary | ICD-10-CM | POA: Diagnosis not present

## 2020-02-18 ENCOUNTER — Ambulatory Visit: Payer: BC Managed Care – PPO | Attending: Internal Medicine

## 2020-02-18 DIAGNOSIS — Z23 Encounter for immunization: Secondary | ICD-10-CM

## 2020-02-18 NOTE — Progress Notes (Signed)
   Covid-19 Vaccination Clinic  Name:  Alvy Alsop    MRN: [id number] DOB: [birth year]  02/18/2020  Mr. Knauer was observed post Covid-19 immunization for 15 minutes without incident. He was provided with Vaccine Information Sheet and instruction to access the V-Safe system.   Mr. Reamy was instructed to call 911 with any severe reactions post vaccine: Marland Kitchen Difficulty breathing  . Swelling of face and throat  . A fast heartbeat  . A bad rash all over body  . Dizziness and weakness   Immunizations Administered    Name Date Dose VIS Date Route   Pfizer COVID-19 Vaccine 02/18/2020  1:51 PM 0.3 mL 11/02/2019 Intramuscular   Manufacturer: [employer], [employer]   Lot: [id number]   NDC: [address]-8

## 2020-03-07 DIAGNOSIS — Z20822 Contact with and (suspected) exposure to covid-19: Secondary | ICD-10-CM | POA: Diagnosis not present

## 2020-03-11 ENCOUNTER — Ambulatory Visit: Payer: BC Managed Care – PPO | Attending: Internal Medicine

## 2020-03-11 DIAGNOSIS — Z23 Encounter for immunization: Secondary | ICD-10-CM

## 2020-03-11 NOTE — Progress Notes (Signed)
   Covid-19 Vaccination Clinic  Name:  Damany Eastman    MRN: [id number] DOB: Dec 27, 1965  03/11/2020  Mr. Nyquist was observed post Covid-19 immunization for 15 minutes without incident. He was provided with Vaccine Information Sheet and instruction to access the V-Safe system.   Mr. Closser was instructed to call 911 with any severe reactions post vaccine: Marland Kitchen Difficulty breathing  . Swelling of face and throat  . A fast heartbeat  . A bad rash all over body  . Dizziness and weakness   Immunizations Administered    Name Date Dose VIS Date Route   Pfizer COVID-19 Vaccine 03/11/2020  2:35 PM 0.3 mL 01/16/2019 Intramuscular   Manufacturer: [employer], [employer]   Lot: [id number]   NDC: [address]-5

## 2020-07-02 DIAGNOSIS — H524 Presbyopia: Secondary | ICD-10-CM | POA: Diagnosis not present

## 2020-07-02 DIAGNOSIS — H52203 Unspecified astigmatism, bilateral: Secondary | ICD-10-CM | POA: Diagnosis not present

## 2020-07-02 DIAGNOSIS — H5213 Myopia, bilateral: Secondary | ICD-10-CM | POA: Diagnosis not present

## 2020-10-23 DIAGNOSIS — Z0000 Encounter for general adult medical examination without abnormal findings: Secondary | ICD-10-CM | POA: Diagnosis not present

## 2020-10-23 DIAGNOSIS — M7712 Lateral epicondylitis, left elbow: Secondary | ICD-10-CM | POA: Diagnosis not present

## 2020-10-23 DIAGNOSIS — M7660 Achilles tendinitis, unspecified leg: Secondary | ICD-10-CM | POA: Diagnosis not present

## 2020-10-23 DIAGNOSIS — E7800 Pure hypercholesterolemia, unspecified: Secondary | ICD-10-CM | POA: Diagnosis not present

## 2020-10-23 DIAGNOSIS — Z125 Encounter for screening for malignant neoplasm of prostate: Secondary | ICD-10-CM | POA: Diagnosis not present

## 2020-10-28 DIAGNOSIS — M25522 Pain in left elbow: Secondary | ICD-10-CM | POA: Diagnosis not present

## 2020-10-28 DIAGNOSIS — S86011A Strain of right Achilles tendon, initial encounter: Secondary | ICD-10-CM | POA: Diagnosis not present

## 2020-11-24 DIAGNOSIS — S86011D Strain of right Achilles tendon, subsequent encounter: Secondary | ICD-10-CM | POA: Diagnosis not present

## 2020-11-24 DIAGNOSIS — M7712 Lateral epicondylitis, left elbow: Secondary | ICD-10-CM | POA: Diagnosis not present

## 2020-11-26 ENCOUNTER — Other Ambulatory Visit: Payer: Self-pay | Admitting: Sports Medicine

## 2020-11-26 DIAGNOSIS — S86011D Strain of right Achilles tendon, subsequent encounter: Secondary | ICD-10-CM

## 2020-12-16 ENCOUNTER — Other Ambulatory Visit: Payer: Self-pay

## 2020-12-16 ENCOUNTER — Ambulatory Visit
Admission: RE | Admit: 2020-12-16 | Discharge: 2020-12-16 | Disposition: A | Discharge status: Home / Self Care | Payer: BC Managed Care – PPO | Source: Ambulatory Visit | Attending: Sports Medicine | Admitting: Sports Medicine

## 2020-12-16 DIAGNOSIS — S86011D Strain of right Achilles tendon, subsequent encounter: Secondary | ICD-10-CM

## 2020-12-16 DIAGNOSIS — M25471 Effusion, right ankle: Secondary | ICD-10-CM | POA: Diagnosis not present

## 2020-12-16 DIAGNOSIS — R531 Weakness: Secondary | ICD-10-CM | POA: Diagnosis not present

## 2020-12-16 DIAGNOSIS — S86011A Strain of right Achilles tendon, initial encounter: Secondary | ICD-10-CM | POA: Diagnosis not present

## 2020-12-16 DIAGNOSIS — M19071 Primary osteoarthritis, right ankle and foot: Secondary | ICD-10-CM | POA: Diagnosis not present

## 2020-12-16 IMAGING — MR MR ANKLE*R* W/O CM
5 series · 40 of 40 positions shown · non-contrast
Comparison: X-ray [DATE]

CLINICAL DATA: Calf weakness.  Concern for Achilles tendon injury

EXAM:
MRI OF THE RIGHT ANKLE WITHOUT CONTRAST
TECHNIQUE: Multiplanar, multisequence MR imaging of the ankle was performed. No
intravenous contrast was administered.

[Series 4: T2 fat-sat · axial · 3.0mm · 0.50mm/px · z∈[-138,-2]mm · 10 of 36 slices shown (1 of 2)]
[im 1/36]
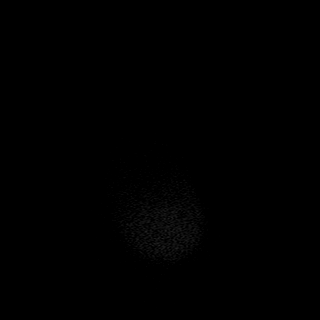
[im 4/36]
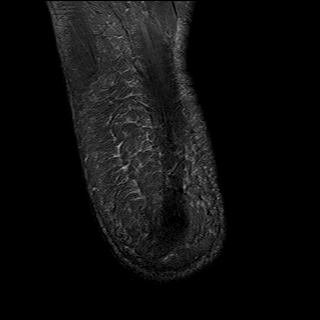
[im 8/36]
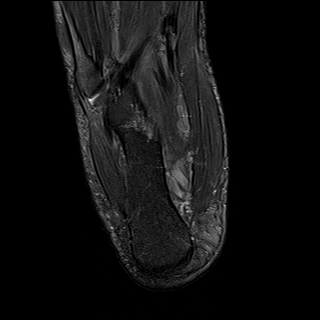
[im 12/36]
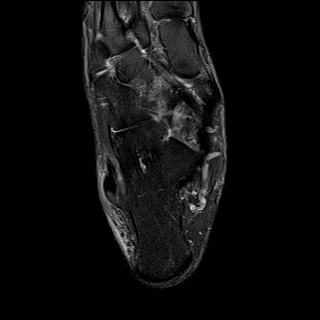
[im 16/36]
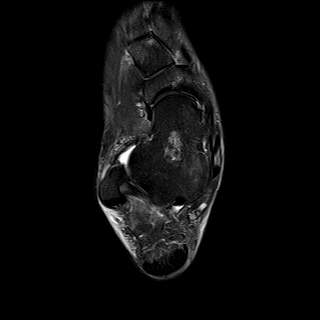
[im 20/36]
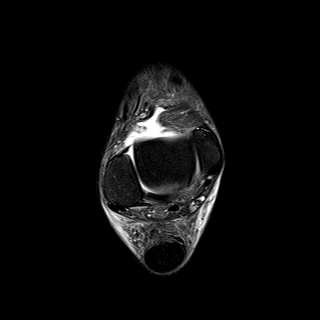
[im 24/36]
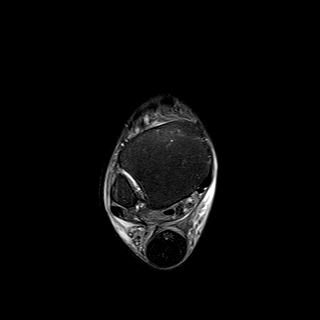
[im 28/36]
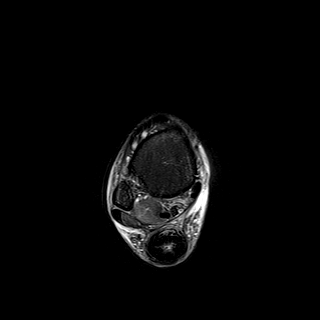
[im 32/36]
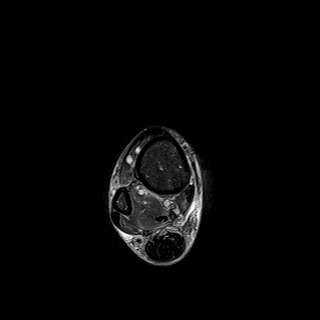
[im 36/36]
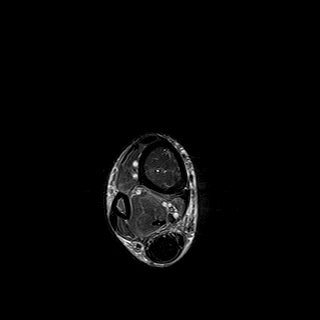

[Series 5: T1 · axial · 3.0mm · 0.50mm/px · z∈[-137,-1]mm · 10 of 36 slices shown (1 of 2)]
[im 1/36]
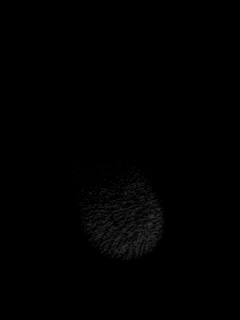
[im 4/36]
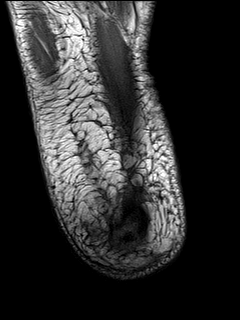
[im 8/36]
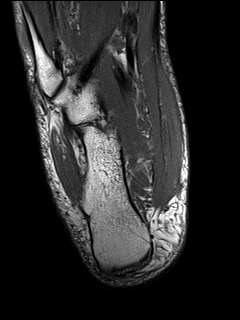
[im 12/36]
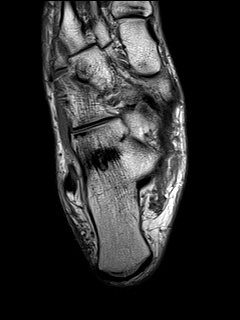
[im 16/36]
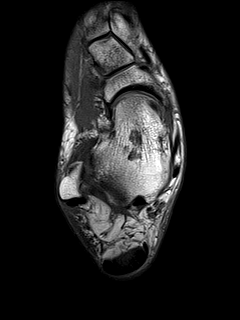
[im 20/36]
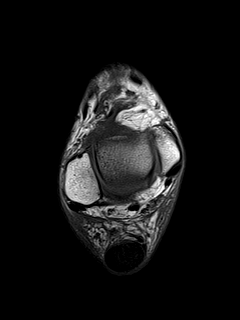
[im 24/36]
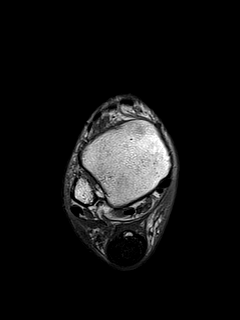
[im 28/36]
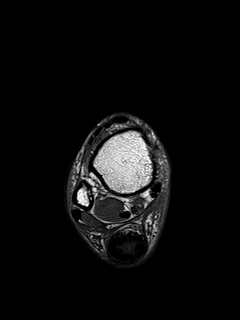
[im 32/36]
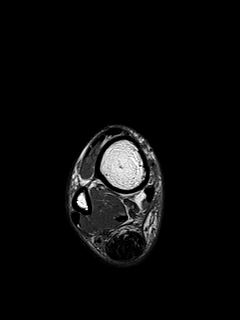
[im 36/36]
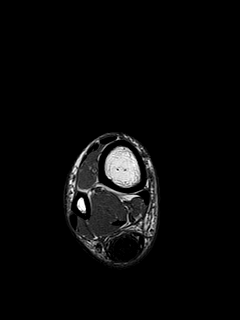

[Series 6: T1 · sagittal · 4.0mm · 0.56mm/px · 5 of 18 slices shown (2 of 2)]
[im 1/18]
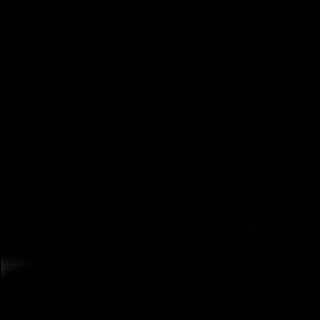
[im 5/18]
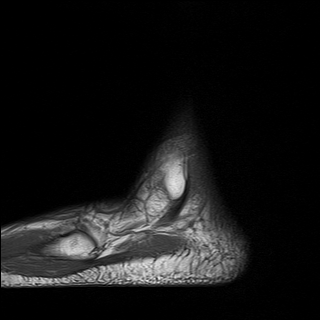
[im 9/18]
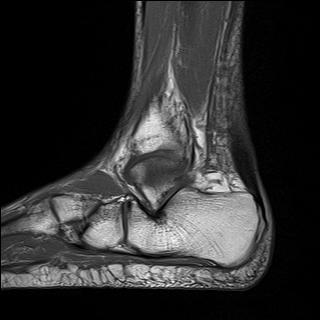
[im 13/18]
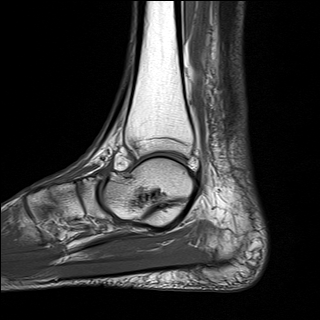
[im 18/18]
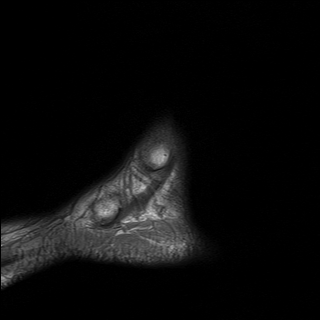

[Series 7: STIR · sagittal · 4.0mm · 0.35mm/px · 5 of 18 slices shown]
[im 1/18]
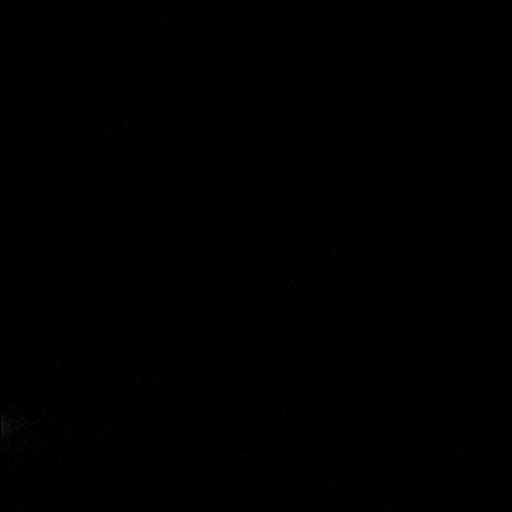
[im 5/18]
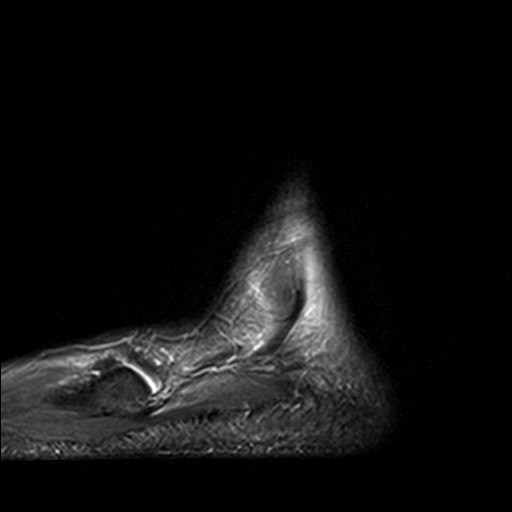
[im 9/18]
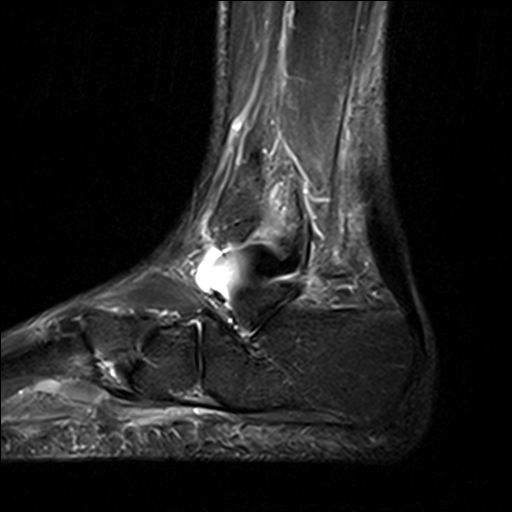
[im 13/18]
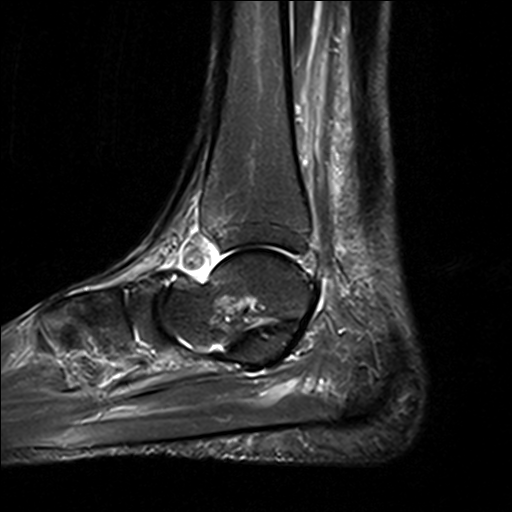
[im 18/18]
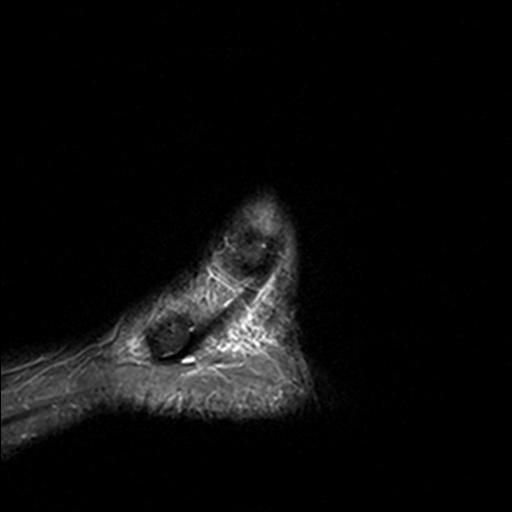

[Series 8: T2 fat-sat · coronal · 3.0mm · 0.50mm/px · 10 of 35 slices shown (2 of 2)]
[im 1/35]
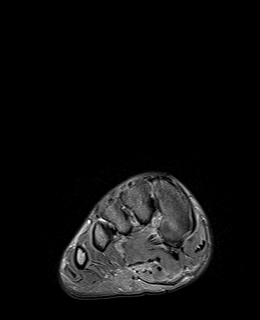
[im 4/35]
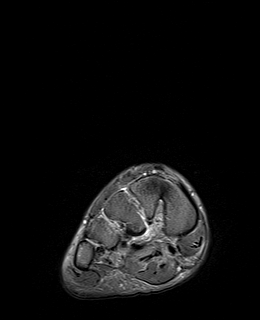
[im 8/35]
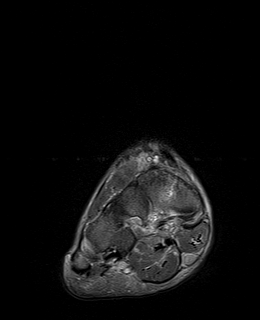
[im 12/35]
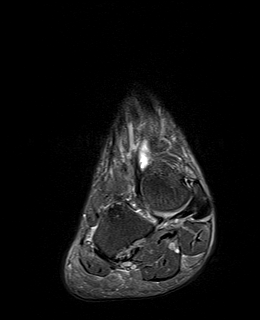
[im 16/35]
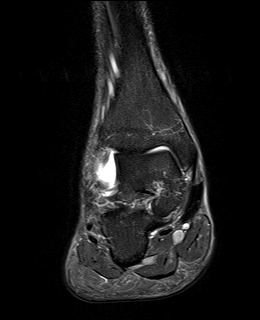
[im 19/35]
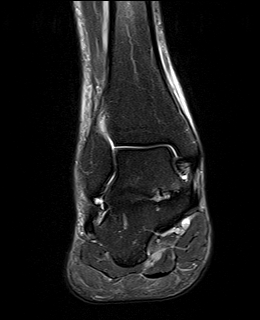
[im 23/35]
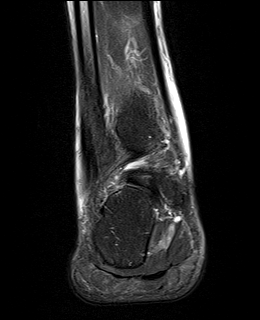
[im 27/35]
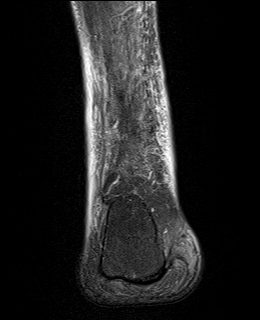
[im 31/35]
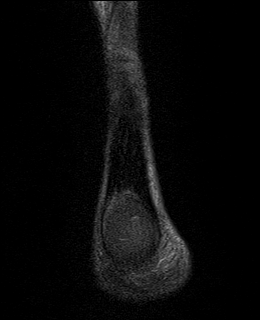
[im 35/35]
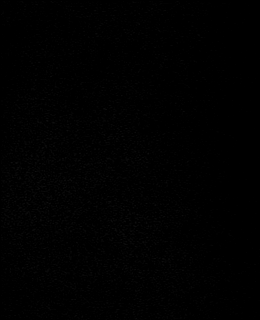

[40 of 40 positions shown; findings below may reference images not displayed]

FINDINGS: TENDONS

Peroneal: Mild tendinosis of the infra-malleolar portions of the
peroneus longus and peroneus brevis tendons without evidence of tear
or tenosynovitis.

Posteromedial: Intact tibialis posterior, flexor hallucis longus and
flexor digitorum longus tendons.

Anterior: Intact tibialis anterior, extensor hallucis longus and
extensor digitorum longus tendons.

Achilles: Marked diffuse thickening of the Achilles tendon measuring
up to 1.4 cm in AP dimension. T2 hyperintense signal within the
central aspect of the tendon 6 cm proximal to the insertion
suggesting interstitial tearing (series 4, image 10). Distal
insertion remains intact without tear. No full-thickness tear of the
Achilles tendon. No peritendinous edema. No retrocalcaneal bursal
fluid.

Plantar Fascia: Intact.

LIGAMENTS

Lateral: Anterior talofibular ligament is irregular and
heterogeneous, but remains intact. No periligamentous edema.
Findings suggest sequela of remote injury. Intact posterior
talofibular ligament which is also slightly thickened. The anterior
and posterior tibiofibular ligaments are intact. Intact
calcaneofibular ligament.

Medial: Deltoid ligament and spring ligament complex intact.

CARTILAGE

Ankle Joint: No cartilage defect.  Small tibiotalar joint effusion.

Subtalar Joints/Sinus Tarsi: Moderate subtalar osteoarthritis most
pronounced medially at the level of the sustentaculum tali (series
8, image 16). No subtalar joint effusion. Preservation of the
anatomic fat within the sinus tarsi.

Bones: No acute fracture or dislocation. Mild degenerative changes
within the midfoot, most notably at the talonavicular joint.

Other: Subcutaneous edema most pronounced at the posterior ankle. No
organized fluid collection or hematoma.
IMPRESSION: 1. Moderate-severe Achilles tendinosis with interstitial tearing. No
full-thickness tear of the Achilles tendon.
2. Mild tendinosis of the infra-malleolar portions of the peroneus
longus and peroneus brevis tendons without evidence of tear.
3. Moderate subtalar osteoarthritis.
4. Sequela of remote prior lateral ankle ligament injury.
5. Small tibiotalar joint effusion.

## 2020-12-24 DIAGNOSIS — M7712 Lateral epicondylitis, left elbow: Secondary | ICD-10-CM | POA: Diagnosis not present

## 2020-12-24 DIAGNOSIS — R262 Difficulty in walking, not elsewhere classified: Secondary | ICD-10-CM | POA: Diagnosis not present

## 2020-12-24 DIAGNOSIS — M79671 Pain in right foot: Secondary | ICD-10-CM | POA: Diagnosis not present

## 2021-01-01 DIAGNOSIS — M7712 Lateral epicondylitis, left elbow: Secondary | ICD-10-CM | POA: Diagnosis not present

## 2021-01-01 DIAGNOSIS — R262 Difficulty in walking, not elsewhere classified: Secondary | ICD-10-CM | POA: Diagnosis not present

## 2021-01-01 DIAGNOSIS — M79671 Pain in right foot: Secondary | ICD-10-CM | POA: Diagnosis not present

## 2021-01-07 DIAGNOSIS — M79671 Pain in right foot: Secondary | ICD-10-CM | POA: Diagnosis not present

## 2021-01-07 DIAGNOSIS — M7712 Lateral epicondylitis, left elbow: Secondary | ICD-10-CM | POA: Diagnosis not present

## 2021-01-07 DIAGNOSIS — R262 Difficulty in walking, not elsewhere classified: Secondary | ICD-10-CM | POA: Diagnosis not present

## 2021-01-15 DIAGNOSIS — M79671 Pain in right foot: Secondary | ICD-10-CM | POA: Diagnosis not present

## 2021-01-15 DIAGNOSIS — M7712 Lateral epicondylitis, left elbow: Secondary | ICD-10-CM | POA: Diagnosis not present

## 2021-01-15 DIAGNOSIS — R262 Difficulty in walking, not elsewhere classified: Secondary | ICD-10-CM | POA: Diagnosis not present

## 2021-01-16 DIAGNOSIS — M7661 Achilles tendinitis, right leg: Secondary | ICD-10-CM | POA: Diagnosis not present

## 2021-01-16 DIAGNOSIS — M25522 Pain in left elbow: Secondary | ICD-10-CM | POA: Diagnosis not present

## 2021-01-19 ENCOUNTER — Other Ambulatory Visit: Payer: Self-pay | Admitting: Sports Medicine

## 2021-01-19 DIAGNOSIS — M25522 Pain in left elbow: Secondary | ICD-10-CM

## 2021-01-22 DIAGNOSIS — M7712 Lateral epicondylitis, left elbow: Secondary | ICD-10-CM | POA: Diagnosis not present

## 2021-01-22 DIAGNOSIS — R262 Difficulty in walking, not elsewhere classified: Secondary | ICD-10-CM | POA: Diagnosis not present

## 2021-01-22 DIAGNOSIS — M79671 Pain in right foot: Secondary | ICD-10-CM | POA: Diagnosis not present

## 2021-01-29 DIAGNOSIS — R262 Difficulty in walking, not elsewhere classified: Secondary | ICD-10-CM | POA: Diagnosis not present

## 2021-01-29 DIAGNOSIS — M7712 Lateral epicondylitis, left elbow: Secondary | ICD-10-CM | POA: Diagnosis not present

## 2021-01-29 DIAGNOSIS — M79671 Pain in right foot: Secondary | ICD-10-CM | POA: Diagnosis not present

## 2021-02-08 ENCOUNTER — Ambulatory Visit
Admission: RE | Admit: 2021-02-08 | Discharge: 2021-02-08 | Disposition: A | Discharge status: Home / Self Care | Payer: BC Managed Care – PPO | Source: Ambulatory Visit | Attending: Sports Medicine | Admitting: Sports Medicine

## 2021-02-08 ENCOUNTER — Other Ambulatory Visit: Payer: Self-pay

## 2021-02-08 DIAGNOSIS — M25522 Pain in left elbow: Secondary | ICD-10-CM

## 2021-02-08 DIAGNOSIS — M25422 Effusion, left elbow: Secondary | ICD-10-CM | POA: Diagnosis not present

## 2021-02-08 DIAGNOSIS — M19022 Primary osteoarthritis, left elbow: Secondary | ICD-10-CM | POA: Diagnosis not present

## 2021-02-08 DIAGNOSIS — R600 Localized edema: Secondary | ICD-10-CM | POA: Diagnosis not present

## 2021-02-08 IMAGING — MR MR ELBOW*L* W/O CM
4 of 5 series · 17 of 40 positions shown · non-contrast
Comparison: X-ray [DATE]

CLINICAL DATA: Left elbow pain for 1 year

EXAM:
MRI OF THE LEFT ELBOW WITHOUT CONTRAST
TECHNIQUE: Multiplanar, multisequence MR imaging of the elbow was performed. No
intravenous contrast was administered.

[Series 3: T1 · axial · left · 3.0mm · 0.16mm/px · z∈[-4,+69]mm · 3 of 27 slices shown]
[im 4/27]
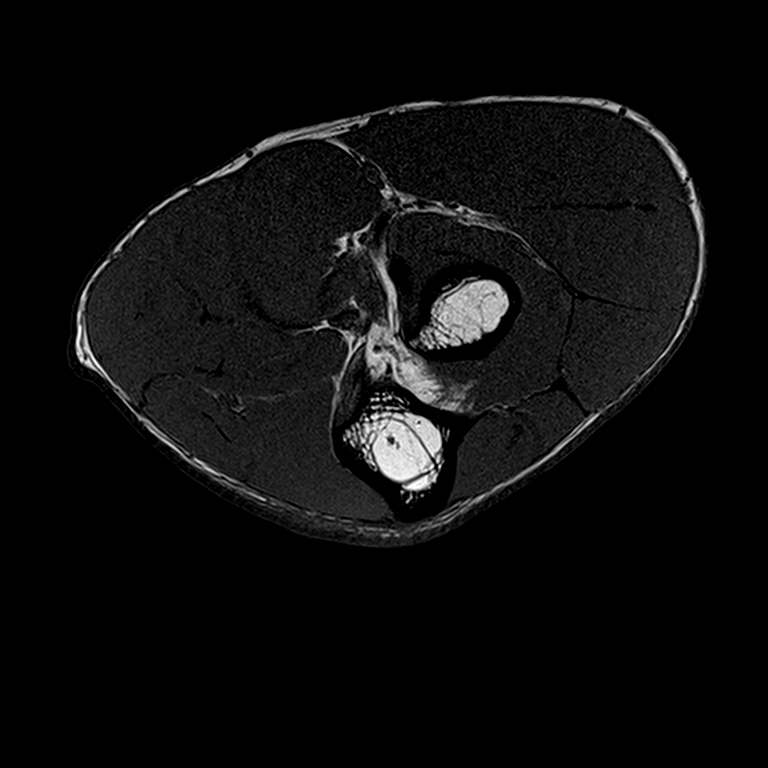
[im 14/27]
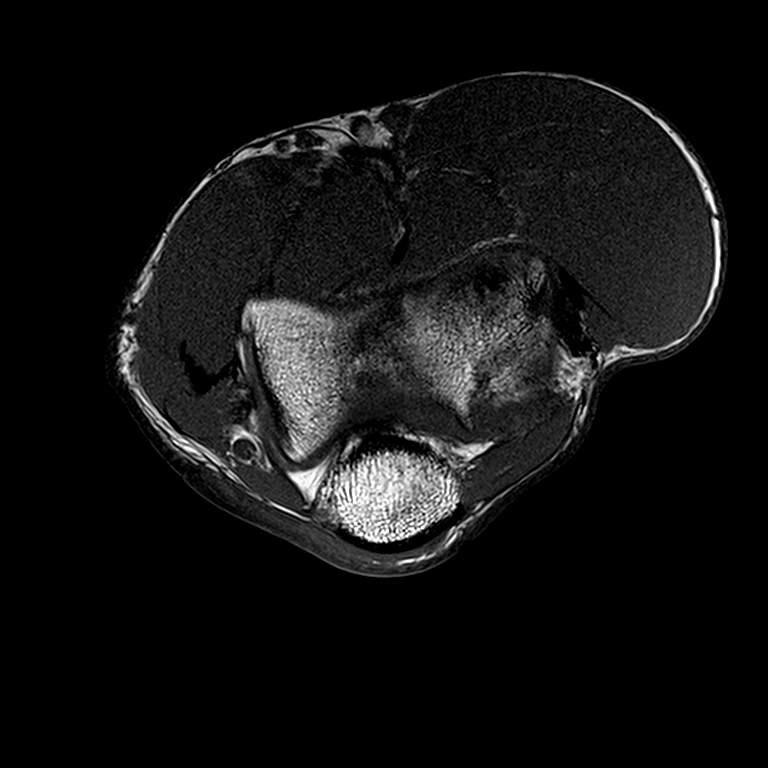
[im 23/27]
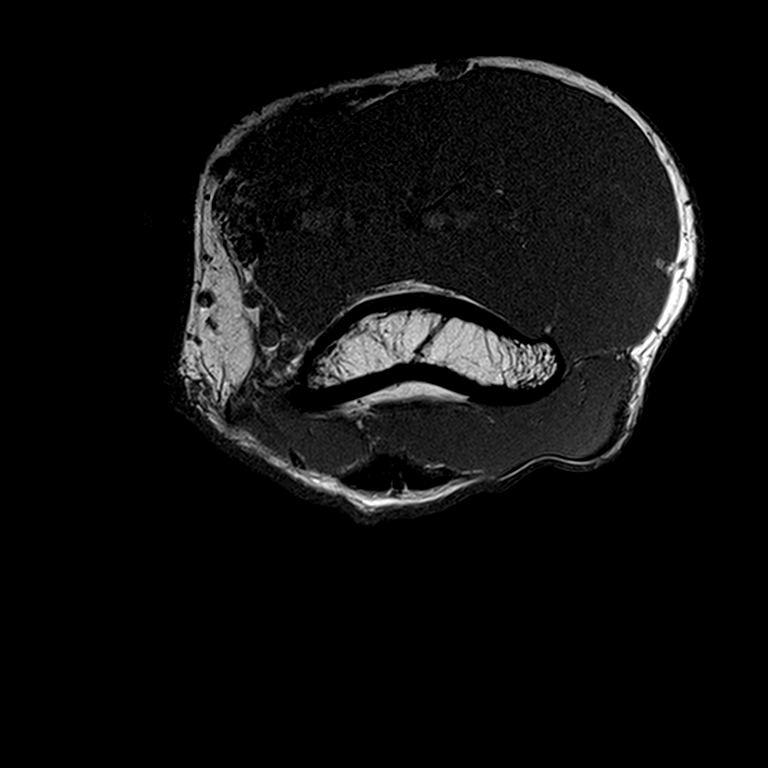

[Series 4: T2 fat-sat · axial · left · 3.0mm · 0.31mm/px · z∈[-16,+84]mm · 8 of 27 slices shown (1 of 3)]
[im 1/27]
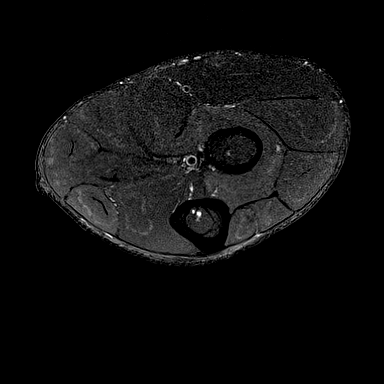
[im 4/27]
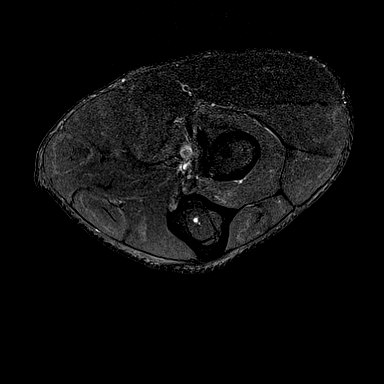
[im 8/27]
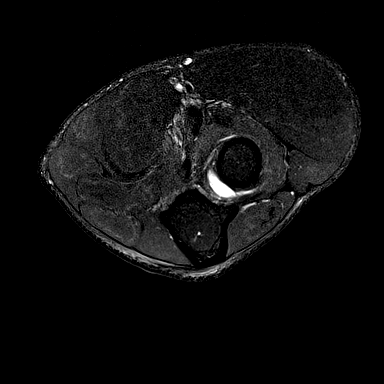
[im 12/27]
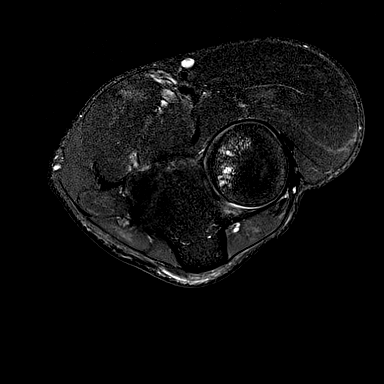
[im 15/27]
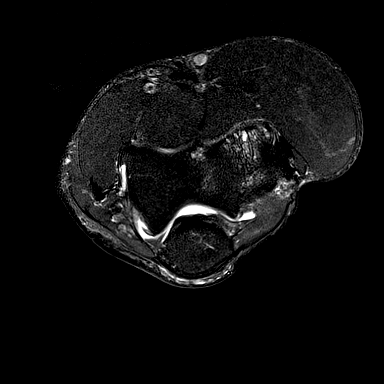
[im 19/27]
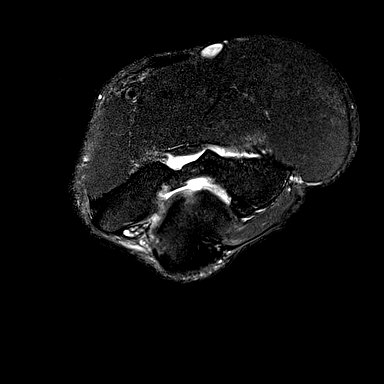
[im 23/27]
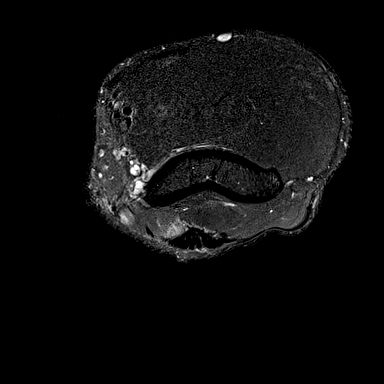
[im 27/27]
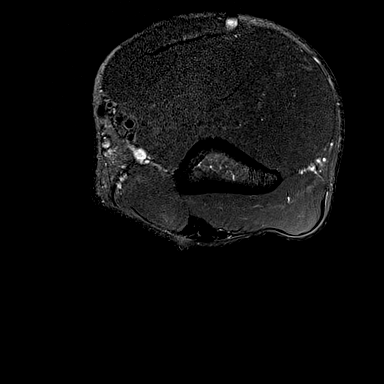

[Series 5: T2 fat-sat · coronal · left · 3.0mm · 0.22mm/px · 3 of 24 slices shown (2 of 3)]
[im 4/24]
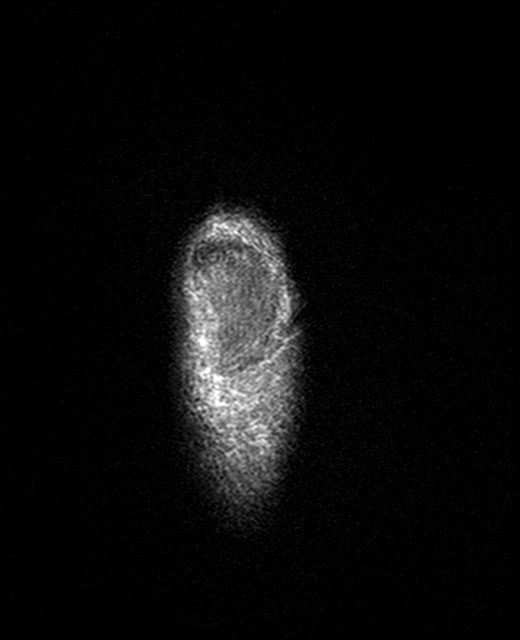
[im 12/24]
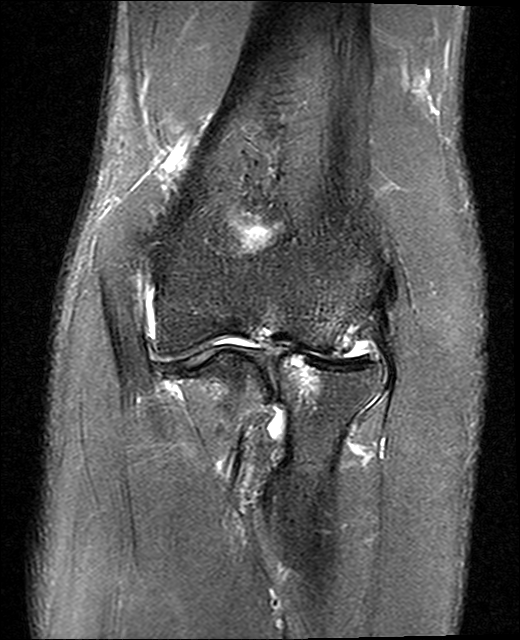
[im 20/24]
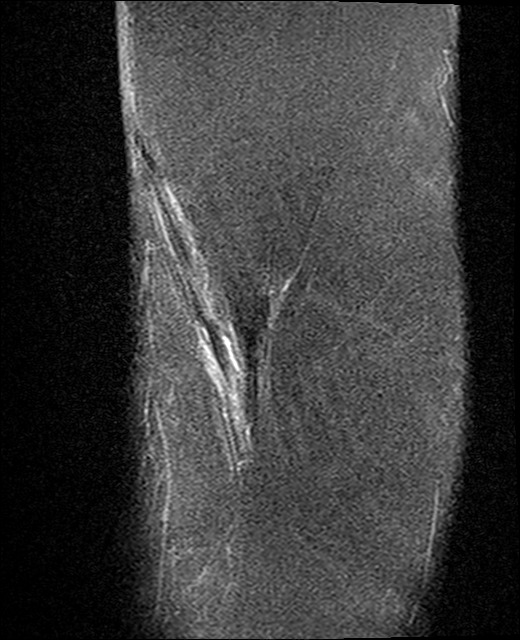

[Series 7: T2 fat-sat · sagittal · left · 3.0mm · 0.22mm/px · 3 of 28 slices shown (3 of 3)]
[im 4/28]
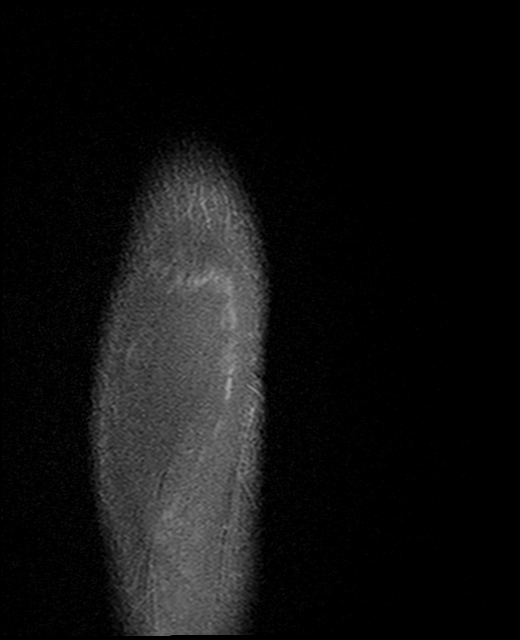
[im 14/28]
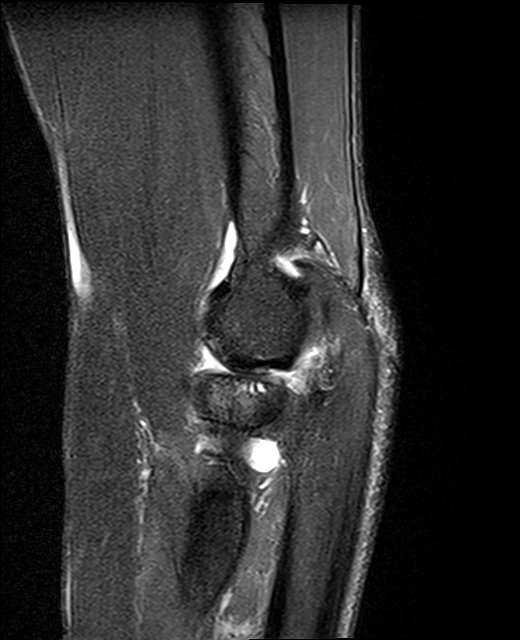
[im 24/28]
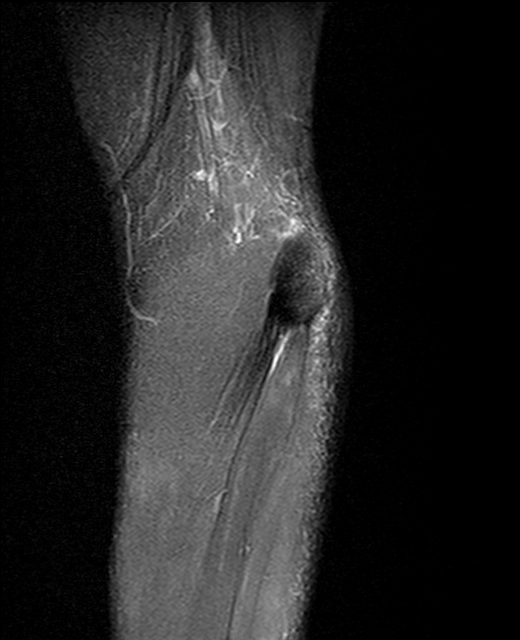

[17 of 40 positions shown; findings below may reference images not displayed]

FINDINGS: TENDONS

Common forearm flexor origin: Intact without tendinosis or tear.

Common forearm extensor origin: Intact without tendinosis or tear.

Biceps: Somewhat thickened appearance of the distal biceps tendon
without tear. No bicipitoradialis bursal fluid.

Triceps: Intact.

LIGAMENTS

Medial stabilizers: Intact.

Lateral stabilizers:  Intact.

Cartilage: Severe degenerative changes of the radiocapitellar joint
with full-thickness cartilage loss and prominent subchondral cystic
changes. No well-defined capitellar osteochondral lesion. Mild joint
space narrowing and marginal osteophyte formation within the ulnar
trochlear joint.

Joint: Small elbow joint effusion. No intra-articular loose body is
seen.

Cubital tunnel: Within normal limits.

Bones: Degenerative subchondral marrow signal changes within the
radial head and capitellum. No acute fracture. No dislocation. No
suspicious bone lesion.

Other: Focal intramuscular edema within the medial head of the
triceps muscle distally (series 7, image 11). Muscles appear
hypertrophic with band-like areas of increased T2 signal intensity
likely reflecting prominent vascularity. No soft tissue edema or
fluid collections.
IMPRESSION: 1. Severe degenerative changes of the radiocapitellar joint with
full-thickness cartilage loss and prominent subchondral cystic
changes. No well-defined capitellar osteochondral lesion is seen.
2. Small elbow joint effusion.
3. Focal intramuscular edema within the medial head of the triceps
muscle distally, which may reflect a muscle strain.
4. Mild distal biceps tendinosis without tear.

## 2021-02-16 DIAGNOSIS — M7712 Lateral epicondylitis, left elbow: Secondary | ICD-10-CM | POA: Diagnosis not present

## 2021-02-16 DIAGNOSIS — R262 Difficulty in walking, not elsewhere classified: Secondary | ICD-10-CM | POA: Diagnosis not present

## 2021-02-16 DIAGNOSIS — M79671 Pain in right foot: Secondary | ICD-10-CM | POA: Diagnosis not present

## 2021-02-17 DIAGNOSIS — M19022 Primary osteoarthritis, left elbow: Secondary | ICD-10-CM | POA: Diagnosis not present

## 2021-07-03 DIAGNOSIS — H04123 Dry eye syndrome of bilateral lacrimal glands: Secondary | ICD-10-CM | POA: Diagnosis not present

## 2021-07-03 DIAGNOSIS — H52203 Unspecified astigmatism, bilateral: Secondary | ICD-10-CM | POA: Diagnosis not present

## 2021-07-03 DIAGNOSIS — H5213 Myopia, bilateral: Secondary | ICD-10-CM | POA: Diagnosis not present

## 2021-07-03 DIAGNOSIS — H524 Presbyopia: Secondary | ICD-10-CM | POA: Diagnosis not present

## 2021-11-12 DIAGNOSIS — Z125 Encounter for screening for malignant neoplasm of prostate: Secondary | ICD-10-CM | POA: Diagnosis not present

## 2021-11-12 DIAGNOSIS — Z0000 Encounter for general adult medical examination without abnormal findings: Secondary | ICD-10-CM | POA: Diagnosis not present

## 2021-11-12 DIAGNOSIS — Z13220 Encounter for screening for lipoid disorders: Secondary | ICD-10-CM | POA: Diagnosis not present

## 2022-07-06 DIAGNOSIS — H31001 Unspecified chorioretinal scars, right eye: Secondary | ICD-10-CM | POA: Diagnosis not present

## 2022-07-06 DIAGNOSIS — H52203 Unspecified astigmatism, bilateral: Secondary | ICD-10-CM | POA: Diagnosis not present

## 2022-07-06 DIAGNOSIS — H5213 Myopia, bilateral: Secondary | ICD-10-CM | POA: Diagnosis not present

## 2022-07-06 DIAGNOSIS — H524 Presbyopia: Secondary | ICD-10-CM | POA: Diagnosis not present

## 2022-08-24 DIAGNOSIS — N6489 Other specified disorders of breast: Secondary | ICD-10-CM | POA: Diagnosis not present

## 2022-08-24 DIAGNOSIS — Z23 Encounter for immunization: Secondary | ICD-10-CM | POA: Diagnosis not present

## 2023-01-04 DIAGNOSIS — Z125 Encounter for screening for malignant neoplasm of prostate: Secondary | ICD-10-CM | POA: Diagnosis not present

## 2023-01-04 DIAGNOSIS — Z13220 Encounter for screening for lipoid disorders: Secondary | ICD-10-CM | POA: Diagnosis not present

## 2023-01-04 DIAGNOSIS — Z130 Encounter for screening for diseases of the blood and blood-forming organs and certain disorders involving the immune mechanism: Secondary | ICD-10-CM | POA: Diagnosis not present

## 2023-01-04 DIAGNOSIS — Z131 Encounter for screening for diabetes mellitus: Secondary | ICD-10-CM | POA: Diagnosis not present

## 2023-01-04 DIAGNOSIS — Z0000 Encounter for general adult medical examination without abnormal findings: Secondary | ICD-10-CM | POA: Diagnosis not present

## 2023-01-24 DIAGNOSIS — R972 Elevated prostate specific antigen [PSA]: Secondary | ICD-10-CM | POA: Diagnosis not present

## 2023-03-24 DIAGNOSIS — R350 Frequency of micturition: Secondary | ICD-10-CM | POA: Diagnosis not present

## 2023-03-24 DIAGNOSIS — N401 Enlarged prostate with lower urinary tract symptoms: Secondary | ICD-10-CM | POA: Diagnosis not present

## 2023-07-12 DIAGNOSIS — H31001 Unspecified chorioretinal scars, right eye: Secondary | ICD-10-CM | POA: Diagnosis not present

## 2023-07-12 DIAGNOSIS — H52203 Unspecified astigmatism, bilateral: Secondary | ICD-10-CM | POA: Diagnosis not present

## 2023-07-12 DIAGNOSIS — H524 Presbyopia: Secondary | ICD-10-CM | POA: Diagnosis not present

## 2023-07-12 DIAGNOSIS — H5213 Myopia, bilateral: Secondary | ICD-10-CM | POA: Diagnosis not present

## 2023-09-28 DIAGNOSIS — R350 Frequency of micturition: Secondary | ICD-10-CM | POA: Diagnosis not present

## 2023-09-28 DIAGNOSIS — N401 Enlarged prostate with lower urinary tract symptoms: Secondary | ICD-10-CM | POA: Diagnosis not present

## 2023-10-10 ENCOUNTER — Other Ambulatory Visit: Payer: Self-pay | Admitting: Urology

## 2023-10-10 DIAGNOSIS — R972 Elevated prostate specific antigen [PSA]: Secondary | ICD-10-CM

## 2023-10-27 ENCOUNTER — Encounter: Payer: Self-pay | Admitting: Urology

## 2023-11-02 ENCOUNTER — Ambulatory Visit
Admission: RE | Admit: 2023-11-02 | Discharge: 2023-11-02 | Disposition: A | Discharge status: Home / Self Care | Payer: BC Managed Care – PPO | Source: Ambulatory Visit | Attending: Urology | Admitting: Urology

## 2023-11-02 DIAGNOSIS — R972 Elevated prostate specific antigen [PSA]: Secondary | ICD-10-CM

## 2023-11-02 MED ORDER — GADOPICLENOL 0.5 MMOL/ML IV SOLN
6.0000 mL | Freq: Once | INTRAVENOUS | Status: AC | PRN
  Administered 2023-11-02: 6 mL via INTRAVENOUS

## 2024-01-06 DIAGNOSIS — Z13228 Encounter for screening for other metabolic disorders: Secondary | ICD-10-CM | POA: Diagnosis not present

## 2024-01-06 DIAGNOSIS — Z125 Encounter for screening for malignant neoplasm of prostate: Secondary | ICD-10-CM | POA: Diagnosis not present

## 2024-01-06 DIAGNOSIS — Z13220 Encounter for screening for lipoid disorders: Secondary | ICD-10-CM | POA: Diagnosis not present

## 2024-01-06 DIAGNOSIS — Z131 Encounter for screening for diabetes mellitus: Secondary | ICD-10-CM | POA: Diagnosis not present

## 2024-01-06 DIAGNOSIS — Z1329 Encounter for screening for other suspected endocrine disorder: Secondary | ICD-10-CM | POA: Diagnosis not present

## 2024-01-06 DIAGNOSIS — E7800 Pure hypercholesterolemia, unspecified: Secondary | ICD-10-CM | POA: Diagnosis not present

## 2024-01-10 DIAGNOSIS — Z0000 Encounter for general adult medical examination without abnormal findings: Secondary | ICD-10-CM | POA: Diagnosis not present

## 2024-01-10 DIAGNOSIS — H6121 Impacted cerumen, right ear: Secondary | ICD-10-CM | POA: Diagnosis not present

## 2024-01-11 ENCOUNTER — Other Ambulatory Visit (HOSPITAL_BASED_OUTPATIENT_CLINIC_OR_DEPARTMENT_OTHER): Payer: Self-pay | Admitting: Family Medicine

## 2024-01-11 DIAGNOSIS — E7800 Pure hypercholesterolemia, unspecified: Secondary | ICD-10-CM

## 2024-01-20 DIAGNOSIS — H60311 Diffuse otitis externa, right ear: Secondary | ICD-10-CM | POA: Diagnosis not present

## 2024-01-25 DIAGNOSIS — H60311 Diffuse otitis externa, right ear: Secondary | ICD-10-CM | POA: Diagnosis not present

## 2024-01-25 DIAGNOSIS — H6121 Impacted cerumen, right ear: Secondary | ICD-10-CM | POA: Diagnosis not present

## 2024-07-11 DIAGNOSIS — N401 Enlarged prostate with lower urinary tract symptoms: Secondary | ICD-10-CM | POA: Diagnosis not present

## 2024-07-13 DIAGNOSIS — H5213 Myopia, bilateral: Secondary | ICD-10-CM | POA: Diagnosis not present

## 2024-07-13 DIAGNOSIS — H52203 Unspecified astigmatism, bilateral: Secondary | ICD-10-CM | POA: Diagnosis not present

## 2024-07-13 DIAGNOSIS — H31001 Unspecified chorioretinal scars, right eye: Secondary | ICD-10-CM | POA: Diagnosis not present

## 2024-07-18 DIAGNOSIS — N401 Enlarged prostate with lower urinary tract symptoms: Secondary | ICD-10-CM | POA: Diagnosis not present

## 2024-07-18 DIAGNOSIS — R350 Frequency of micturition: Secondary | ICD-10-CM | POA: Diagnosis not present

## 2024-07-18 DIAGNOSIS — R972 Elevated prostate specific antigen [PSA]: Secondary | ICD-10-CM | POA: Diagnosis not present
